# Patient Record
Sex: Female | Born: 1960 | Race: Black or African American | Hispanic: No | Marital: Single | Smoking: Former smoker
Health system: Southern US, Community
[De-identification: ages and names within clinical notes are randomized; demographics above are authoritative.]

## PROBLEM LIST (undated history)

## (undated) DIAGNOSIS — R0603 Acute respiratory distress: Secondary | ICD-10-CM

## (undated) DIAGNOSIS — F419 Anxiety disorder, unspecified: Secondary | ICD-10-CM

## (undated) DIAGNOSIS — J189 Pneumonia, unspecified organism: Secondary | ICD-10-CM

## (undated) DIAGNOSIS — J449 Chronic obstructive pulmonary disease, unspecified: Secondary | ICD-10-CM

## (undated) HISTORY — PX: KNEE ARTHROPLASTY: SHX992

---
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
## 2012-01-24 DIAGNOSIS — R0602 Shortness of breath: Secondary | ICD-10-CM

## 2012-01-25 DIAGNOSIS — R0602 Shortness of breath: Secondary | ICD-10-CM

## 2018-10-17 DIAGNOSIS — R0603 Acute respiratory distress: Secondary | ICD-10-CM

## 2018-10-17 HISTORY — DX: Acute respiratory distress: R06.03

## 2018-10-25 ENCOUNTER — Inpatient Hospital Stay (HOSPITAL_COMMUNITY): Payer: Medicaid Other

## 2018-10-25 ENCOUNTER — Inpatient Hospital Stay (HOSPITAL_COMMUNITY)
Admission: AD | Admit: 2018-10-25 | Discharge: 2018-11-04 | DRG: 870 | Disposition: A | Discharge status: Home Health | Payer: Medicaid Other | Source: Other Acute Inpatient Hospital | Attending: Internal Medicine | Admitting: Internal Medicine

## 2018-10-25 DIAGNOSIS — B952 Enterococcus as the cause of diseases classified elsewhere: Secondary | ICD-10-CM | POA: Diagnosis not present

## 2018-10-25 DIAGNOSIS — N390 Urinary tract infection, site not specified: Secondary | ICD-10-CM | POA: Diagnosis present

## 2018-10-25 DIAGNOSIS — Z6841 Body Mass Index (BMI) 40.0 and over, adult: Secondary | ICD-10-CM

## 2018-10-25 DIAGNOSIS — Z87891 Personal history of nicotine dependence: Secondary | ICD-10-CM | POA: Diagnosis not present

## 2018-10-25 DIAGNOSIS — Z7401 Bed confinement status: Secondary | ICD-10-CM

## 2018-10-25 DIAGNOSIS — J9602 Acute respiratory failure with hypercapnia: Secondary | ICD-10-CM

## 2018-10-25 DIAGNOSIS — E662 Morbid (severe) obesity with alveolar hypoventilation: Secondary | ICD-10-CM | POA: Diagnosis present

## 2018-10-25 DIAGNOSIS — J9600 Acute respiratory failure, unspecified whether with hypoxia or hypercapnia: Secondary | ICD-10-CM | POA: Diagnosis present

## 2018-10-25 DIAGNOSIS — A4181 Sepsis due to Enterococcus: Principal | ICD-10-CM | POA: Diagnosis present

## 2018-10-25 DIAGNOSIS — J449 Chronic obstructive pulmonary disease, unspecified: Secondary | ICD-10-CM | POA: Diagnosis not present

## 2018-10-25 DIAGNOSIS — Z9981 Dependence on supplemental oxygen: Secondary | ICD-10-CM

## 2018-10-25 DIAGNOSIS — B9689 Other specified bacterial agents as the cause of diseases classified elsewhere: Secondary | ICD-10-CM | POA: Diagnosis not present

## 2018-10-25 DIAGNOSIS — J9622 Acute and chronic respiratory failure with hypercapnia: Secondary | ICD-10-CM | POA: Diagnosis present

## 2018-10-25 DIAGNOSIS — Z9119 Patient's noncompliance with other medical treatment and regimen: Secondary | ICD-10-CM | POA: Diagnosis not present

## 2018-10-25 DIAGNOSIS — I110 Hypertensive heart disease with heart failure: Secondary | ICD-10-CM | POA: Diagnosis present

## 2018-10-25 DIAGNOSIS — G4733 Obstructive sleep apnea (adult) (pediatric): Secondary | ICD-10-CM | POA: Diagnosis not present

## 2018-10-25 DIAGNOSIS — R5381 Other malaise: Secondary | ICD-10-CM

## 2018-10-25 DIAGNOSIS — R451 Restlessness and agitation: Secondary | ICD-10-CM | POA: Diagnosis not present

## 2018-10-25 DIAGNOSIS — I272 Pulmonary hypertension, unspecified: Secondary | ICD-10-CM | POA: Diagnosis present

## 2018-10-25 DIAGNOSIS — E875 Hyperkalemia: Secondary | ICD-10-CM | POA: Diagnosis present

## 2018-10-25 DIAGNOSIS — G9341 Metabolic encephalopathy: Secondary | ICD-10-CM

## 2018-10-25 DIAGNOSIS — J9621 Acute and chronic respiratory failure with hypoxia: Secondary | ICD-10-CM | POA: Diagnosis present

## 2018-10-25 DIAGNOSIS — J9690 Respiratory failure, unspecified, unspecified whether with hypoxia or hypercapnia: Secondary | ICD-10-CM

## 2018-10-25 DIAGNOSIS — J441 Chronic obstructive pulmonary disease with (acute) exacerbation: Secondary | ICD-10-CM

## 2018-10-25 DIAGNOSIS — J9601 Acute respiratory failure with hypoxia: Secondary | ICD-10-CM

## 2018-10-25 DIAGNOSIS — L602 Onychogryphosis: Secondary | ICD-10-CM

## 2018-10-25 DIAGNOSIS — R7881 Bacteremia: Secondary | ICD-10-CM | POA: Diagnosis not present

## 2018-10-25 DIAGNOSIS — I5033 Acute on chronic diastolic (congestive) heart failure: Secondary | ICD-10-CM | POA: Diagnosis present

## 2018-10-25 DIAGNOSIS — Z20828 Contact with and (suspected) exposure to other viral communicable diseases: Secondary | ICD-10-CM | POA: Diagnosis present

## 2018-10-25 DIAGNOSIS — J9691 Respiratory failure, unspecified with hypoxia: Secondary | ICD-10-CM | POA: Diagnosis present

## 2018-10-25 DIAGNOSIS — I5030 Unspecified diastolic (congestive) heart failure: Secondary | ICD-10-CM | POA: Diagnosis not present

## 2018-10-25 DIAGNOSIS — G9340 Encephalopathy, unspecified: Secondary | ICD-10-CM | POA: Diagnosis not present

## 2018-10-25 DIAGNOSIS — Z9911 Dependence on respirator [ventilator] status: Secondary | ICD-10-CM | POA: Diagnosis not present

## 2018-10-25 DIAGNOSIS — F419 Anxiety disorder, unspecified: Secondary | ICD-10-CM | POA: Diagnosis present

## 2018-10-25 HISTORY — DX: Anxiety disorder, unspecified: F41.9

## 2018-10-25 HISTORY — DX: Pneumonia, unspecified organism: J18.9

## 2018-10-25 HISTORY — DX: Chronic obstructive pulmonary disease, unspecified: J44.9

## 2018-10-25 LAB — AMMONIA
Ammonia: 36 umol/L — ABNORMAL HIGH (ref 9–35)
Ammonia: 26 umol/L (ref 9–35)

## 2018-10-25 LAB — CBC WITH DIFFERENTIAL/PLATELET
Abs Immature Granulocytes: 0.13 10*3/uL — ABNORMAL HIGH (ref 0.00–0.07)
Basophils Absolute: 0 10*3/uL (ref 0.0–0.1)
Basophils Relative: 0 %
Eosinophils Absolute: 0 10*3/uL (ref 0.0–0.5)
Eosinophils Relative: 0 %
HCT: 33.7 % — ABNORMAL LOW (ref 36.0–46.0)
Hemoglobin: 9.4 g/dL — ABNORMAL LOW (ref 12.0–15.0)
Immature Granulocytes: 1 %
Lymphocytes Relative: 6 %
Lymphs Abs: 0.6 10*3/uL — ABNORMAL LOW (ref 0.7–4.0)
MCH: 27.2 pg (ref 26.0–34.0)
MCHC: 27.9 g/dL — ABNORMAL LOW (ref 30.0–36.0)
MCV: 97.4 fL (ref 80.0–100.0)
Monocytes Absolute: 0.3 10*3/uL (ref 0.1–1.0)
Monocytes Relative: 3 %
Neutro Abs: 9 10*3/uL — ABNORMAL HIGH (ref 1.7–7.7)
Neutrophils Relative %: 90 %
Platelets: 170 10*3/uL (ref 150–400)
RBC: 3.46 MIL/uL — ABNORMAL LOW (ref 3.87–5.11)
RDW: 14.6 % (ref 11.5–15.5)
WBC: 10 10*3/uL (ref 4.0–10.5)
nRBC: 0.5 % — ABNORMAL HIGH (ref 0.0–0.2)

## 2018-10-25 LAB — URINALYSIS, ROUTINE W REFLEX MICROSCOPIC
Bilirubin Urine: NEGATIVE
Glucose, UA: NEGATIVE mg/dL
Ketones, ur: NEGATIVE mg/dL
Nitrite: NEGATIVE
Protein, ur: 30 mg/dL — AB
Specific Gravity, Urine: 1.018 (ref 1.005–1.030)
WBC, UA: >50 WBC/hpf — ABNORMAL HIGH (ref 0–5)
pH: 5 (ref 5.0–8.0)

## 2018-10-25 LAB — POCT I-STAT 7, (LYTES, BLD GAS, ICA,H+H)
Acid-Base Excess: 9 mmol/L — ABNORMAL HIGH (ref 0.0–2.0)
Bicarbonate: 36.6 mmol/L — ABNORMAL HIGH (ref 20.0–28.0)
Calcium, Ion: 1.22 mmol/L (ref 1.15–1.40)
HCT: 32 % — ABNORMAL LOW (ref 36.0–46.0)
Hemoglobin: 10.9 g/dL — ABNORMAL LOW (ref 12.0–15.0)
O2 Saturation: 94 %
Patient temperature: 98.6
Potassium: 4.4 mmol/L (ref 3.5–5.1)
Sodium: 142 mmol/L (ref 135–145)
TCO2: 39 mmol/L — ABNORMAL HIGH (ref 22–32)
pCO2 arterial: 70.1 mmHg (ref 32.0–48.0)
pH, Arterial: 7.326 — ABNORMAL LOW (ref 7.350–7.450)
pO2, Arterial: 78 mmHg — ABNORMAL LOW (ref 83.0–108.0)

## 2018-10-25 LAB — COMPREHENSIVE METABOLIC PANEL
ALT: 11 U/L (ref 0–44)
AST: 18 U/L (ref 15–41)
Albumin: 2.4 g/dL — ABNORMAL LOW (ref 3.5–5.0)
Alkaline Phosphatase: 87 U/L (ref 38–126)
Anion gap: 11 (ref 5–15)
BUN: 35 mg/dL — ABNORMAL HIGH (ref 6–20)
CO2: 29 mmol/L (ref 22–32)
Calcium: 8.5 mg/dL — ABNORMAL LOW (ref 8.9–10.3)
Chloride: 99 mmol/L (ref 98–111)
Creatinine, Ser: 1.41 mg/dL — ABNORMAL HIGH (ref 0.44–1.00)
GFR calc Af Amer: 47 mL/min — ABNORMAL LOW (ref >60)
GFR calc non Af Amer: 41 mL/min — ABNORMAL LOW (ref >60)
Glucose, Bld: 128 mg/dL — ABNORMAL HIGH (ref 70–99)
Potassium: 4.7 mmol/L (ref 3.5–5.1)
Sodium: 139 mmol/L (ref 135–145)
Total Bilirubin: 0.5 mg/dL (ref 0.3–1.2)
Total Protein: 7.5 g/dL (ref 6.5–8.1)

## 2018-10-25 LAB — GLUCOSE, CAPILLARY
Glucose-Capillary: 105 mg/dL — ABNORMAL HIGH (ref 70–99)
Glucose-Capillary: 108 mg/dL — ABNORMAL HIGH (ref 70–99)
Glucose-Capillary: 113 mg/dL — ABNORMAL HIGH (ref 70–99)
Glucose-Capillary: 126 mg/dL — ABNORMAL HIGH (ref 70–99)
Glucose-Capillary: 138 mg/dL — ABNORMAL HIGH (ref 70–99)
Glucose-Capillary: 209 mg/dL — ABNORMAL HIGH (ref 70–99)

## 2018-10-25 LAB — PROCALCITONIN
Procalcitonin: <0.1 ng/mL
Procalcitonin: <0.1 ng/mL

## 2018-10-25 LAB — SARS CORONAVIRUS 2: SARS Coronavirus 2: NOT DETECTED

## 2018-10-25 LAB — MAGNESIUM: Magnesium: 1.9 mg/dL (ref 1.7–2.4)

## 2018-10-25 LAB — HIV ANTIBODY (ROUTINE TESTING W REFLEX): HIV Screen 4th Generation wRfx: NONREACTIVE

## 2018-10-25 LAB — PHOSPHORUS: Phosphorus: 2.1 mg/dL — ABNORMAL LOW (ref 2.5–4.6)

## 2018-10-25 LAB — MRSA PCR SCREENING: MRSA by PCR: NEGATIVE

## 2018-10-25 MED ORDER — SODIUM CHLORIDE 0.9 % IV SOLN
0.5000 mg/h | INTRAVENOUS | Status: DC
  Administered 2018-10-25: 1 mg/h via INTRAVENOUS
  Administered 2018-10-27 – 2018-10-29 (×5): 4 mg/h via INTRAVENOUS
  Administered 2018-10-30: 2 mg/h via INTRAVENOUS
  Filled 2018-10-25 (×8): qty 5

## 2018-10-25 MED ORDER — SODIUM CHLORIDE 0.9 % IV SOLN
1.0000 g | INTRAVENOUS | Status: DC
  Administered 2018-10-25: 1 g via INTRAVENOUS
  Filled 2018-10-25: qty 10

## 2018-10-25 MED ORDER — BISACODYL 10 MG RE SUPP: 10.0000 mg | Freq: Every day | RECTAL | Status: DC | PRN

## 2018-10-25 MED ORDER — FENTANYL BOLUS VIA INFUSION
50.0000 ug | INTRAVENOUS | Status: DC | PRN
  Administered 2018-10-25: 50 ug via INTRAVENOUS
  Filled 2018-10-25: qty 50

## 2018-10-25 MED ORDER — FUROSEMIDE 10 MG/ML IJ SOLN
40.0000 mg | Freq: Every day | INTRAMUSCULAR | Status: DC
  Administered 2018-10-25 – 2018-10-30 (×6): 40 mg via INTRAVENOUS
  Filled 2018-10-25 (×6): qty 4

## 2018-10-25 MED ORDER — CHLORHEXIDINE GLUCONATE 0.12% ORAL RINSE (MEDLINE KIT)
15.0000 mL | Freq: Two times a day (BID) | OROMUCOSAL | Status: DC
  Administered 2018-10-25 – 2018-10-30 (×11): 15 mL via OROMUCOSAL

## 2018-10-25 MED ORDER — SODIUM CHLORIDE 0.9 % IV SOLN
2.0000 g | Freq: Four times a day (QID) | INTRAVENOUS | Status: DC
  Administered 2018-10-25 – 2018-10-26 (×4): 2 g via INTRAVENOUS
  Filled 2018-10-25 (×6): qty 2000

## 2018-10-25 MED ORDER — FENTANYL CITRATE (PF) 100 MCG/2ML IJ SOLN
50.0000 ug | Freq: Once | INTRAMUSCULAR | Status: AC
  Administered 2018-10-25: 50 ug via INTRAVENOUS

## 2018-10-25 MED ORDER — METHYLPREDNISOLONE SODIUM SUCC 125 MG IJ SOLR
40.0000 mg | Freq: Four times a day (QID) | INTRAMUSCULAR | Status: DC
  Administered 2018-10-25 – 2018-10-27 (×10): 40 mg via INTRAVENOUS
  Filled 2018-10-25 (×10): qty 2

## 2018-10-25 MED ORDER — IPRATROPIUM-ALBUTEROL 0.5-2.5 (3) MG/3ML IN SOLN
3.0000 mL | Freq: Four times a day (QID) | RESPIRATORY_TRACT | Status: DC
  Administered 2018-10-25 – 2018-10-31 (×27): 3 mL via RESPIRATORY_TRACT
  Filled 2018-10-25 (×27): qty 3

## 2018-10-25 MED ORDER — MIDAZOLAM HCL 2 MG/2ML IJ SOLN
1.0000 mg | INTRAMUSCULAR | Status: DC | PRN
  Administered 2018-10-26 – 2018-10-27 (×9): 1 mg via INTRAVENOUS
  Filled 2018-10-25 (×11): qty 2

## 2018-10-25 MED ORDER — PANTOPRAZOLE SODIUM 40 MG IV SOLR
40.0000 mg | Freq: Every day | INTRAVENOUS | Status: DC
  Administered 2018-10-25 – 2018-10-27 (×4): 40 mg via INTRAVENOUS
  Filled 2018-10-25 (×4): qty 40

## 2018-10-25 MED ORDER — BUDESONIDE 0.5 MG/2ML IN SUSP
0.5000 mg | Freq: Two times a day (BID) | RESPIRATORY_TRACT | Status: DC
  Administered 2018-10-25 – 2018-11-04 (×19): 0.5 mg via RESPIRATORY_TRACT
  Filled 2018-10-25 (×21): qty 2

## 2018-10-25 MED ORDER — INSULIN ASPART 100 UNIT/ML ~~LOC~~ SOLN
0.0000 [IU] | SUBCUTANEOUS | Status: DC
  Administered 2018-10-25: 2 [IU] via SUBCUTANEOUS
  Administered 2018-10-25: 3 [IU] via SUBCUTANEOUS
  Administered 2018-10-26 – 2018-10-28 (×8): 1 [IU] via SUBCUTANEOUS
  Administered 2018-10-28: 2 [IU] via SUBCUTANEOUS
  Administered 2018-10-28 – 2018-10-29 (×8): 1 [IU] via SUBCUTANEOUS
  Administered 2018-10-29: 2 [IU] via SUBCUTANEOUS
  Administered 2018-10-30: 1 [IU] via SUBCUTANEOUS
  Administered 2018-10-30 (×2): 2 [IU] via SUBCUTANEOUS
  Administered 2018-10-30: 1 [IU] via SUBCUTANEOUS
  Administered 2018-10-31: 2 [IU] via SUBCUTANEOUS
  Administered 2018-10-31: 05:00:00 1 [IU] via SUBCUTANEOUS
  Administered 2018-11-01: 2 [IU] via SUBCUTANEOUS
  Administered 2018-11-01 (×2): 1 [IU] via SUBCUTANEOUS
  Administered 2018-11-01: 2 [IU] via SUBCUTANEOUS
  Administered 2018-11-02 – 2018-11-03 (×5): 1 [IU] via SUBCUTANEOUS

## 2018-10-25 MED ORDER — ALBUTEROL SULFATE (2.5 MG/3ML) 0.083% IN NEBU: 2.5000 mg | INHALATION_SOLUTION | RESPIRATORY_TRACT | Status: DC | PRN

## 2018-10-25 MED ORDER — ARFORMOTEROL TARTRATE 15 MCG/2ML IN NEBU
15.0000 ug | INHALATION_SOLUTION | Freq: Two times a day (BID) | RESPIRATORY_TRACT | Status: DC
  Administered 2018-10-25 – 2018-11-04 (×19): 15 ug via RESPIRATORY_TRACT
  Filled 2018-10-25 (×23): qty 2

## 2018-10-25 MED ORDER — SODIUM CHLORIDE 0.9 % IV SOLN
2.0000 g | Freq: Three times a day (TID) | INTRAVENOUS | Status: DC
  Administered 2018-10-25 – 2018-10-26 (×3): 2 g via INTRAVENOUS
  Filled 2018-10-25 (×5): qty 2

## 2018-10-25 MED ORDER — DEXMEDETOMIDINE HCL IN NACL 400 MCG/100ML IV SOLN
0.0000 ug/kg/h | INTRAVENOUS | Status: DC
  Administered 2018-10-25: 0.4 ug/kg/h via INTRAVENOUS
  Filled 2018-10-25: qty 100

## 2018-10-25 MED ORDER — ORAL CARE MOUTH RINSE
15.0000 mL | OROMUCOSAL | Status: DC
  Administered 2018-10-25 – 2018-10-30 (×52): 15 mL via OROMUCOSAL

## 2018-10-25 MED ORDER — DOCUSATE SODIUM 50 MG/5ML PO LIQD: 100.0000 mg | Freq: Two times a day (BID) | ORAL | Status: DC | PRN

## 2018-10-25 MED ORDER — HEPARIN SODIUM (PORCINE) 5000 UNIT/ML IJ SOLN
5000.0000 [IU] | Freq: Three times a day (TID) | INTRAMUSCULAR | Status: DC
  Administered 2018-10-25 – 2018-11-04 (×31): 5000 [IU] via SUBCUTANEOUS
  Filled 2018-10-25 (×31): qty 1

## 2018-10-25 MED ORDER — FENTANYL 2500MCG IN NS 250ML (10MCG/ML) PREMIX INFUSION
50.0000 ug/h | INTRAVENOUS | Status: DC
  Administered 2018-10-25: 50 ug/h via INTRAVENOUS
  Filled 2018-10-25: qty 250

## 2018-10-25 NOTE — Progress Notes (Signed)
175 ml Fentanyl wasted in sink with Virl Diamond RN.

## 2018-10-25 NOTE — H&P (Addendum)
NAME:  Laura PilotRosa Stewart, MRN:  [id number], DOB:  01/09/1961, LOS: 0 ADMISSION DATE:  10/25/2018, CONSULTATION DATE:  10/25/2018 REFERRING MD:  [facility], CHIEF COMPLAINT:  Acute Respiratory failure  Brief History   [age] yoF [facility] with morbid obesity and acute on chronic hypoxic respiratory failure and UTI.   History of present illness   HPI obtained from medical chart review as patient is intubated and sedated on mechanical ventilation.   58 year old female with history of morbid obesity with BMI 110, chronic respiratory failure on 3L [place], COPD, Pulmonary hypertension, OSA, non-compliant with CPAP, and Diastolic HF transferred [facility] for acute on chronic respiratory failure.   She was found by EMS unresponsive at home with hypoxia in the 50's.  On arrival [facility], she remained hypoxic on NRB with ABG showing pH 7.12, pCO2 125.7, PO2 44.3.  She was given lasix and required intubation.  She had tranisent hypotension on fentanyl and versed drips requiring levophed briefly.  CXR showed no infiltrate.  Workup noted for normal EKG with neg troponin, BNP 79, lower extremity ultrasound was negative for DVT, BMP showed mild hyperkalemia of 5.9 which was treated with kayexalate, otherwise normal.  UA showed moderate leukocytes with 6-10 WBC, WBC 9, normal procalcitonin, UDS negative,   Ammonia 41, normal LFTs, COVID negative.  Limited echo showed grade 2 diastolic dysfunction with EF 50-55%.  She was started on ceftriaxone for UTI and azithromycin for possible COPD exacerbation.   Blood cultures obtained showed GPC and enterococcus species and therefore started on vancomycin.   [facility] resources since patient's BMI exceeded their bariatric capabilities.  PCCM to accept.   Past Medical History  Chronic respiratory failure on 3L [place] O2 COPD Pulmonary hypertension OSA, non-compliant with CPAP Diastolic HF Former smoker   Significant Hospital Events   6[facility] to [facility]  Consults:   Procedures:  6/8  ETT >> 6/8  Foley >>  Significant Diagnostic Tests:  OHS- 6/8 BLE venous dopplers >> neg DVT            6/8 TTE- limited study, G2DD, EF 50-55%  Micro Data:  [place] 6/8 BC x 2 >> GPC and enterococcus species                     UC >>          COVID >> neg  6/9 BC x 2 >> 6/9 UC >>  6/9 trach asp >> 6/9 COVID >>  Antimicrobials:  6/8 azithro, ceftriaxone, vancomcyin 6/9 ceftriaxone >> 6/9 vancomcyin >>  Interim history/subjective:  Arrived on fentanyl drip at 510 mcg/hr Intermittently followed commands and reached for ETT for RN  Objective   Blood pressure (!) 112/95, pulse 86, temperature 98.6 F (37 C), temperature source Oral, resp. rate 18, SpO2 97 %.    Vent Mode: PRVC FiO2 (%):  [40 %-50 %] 40 % Set Rate:  [20 bmp] 20 bmp Vt Set:  [450 mL-500 mL] 500 mL PEEP:  [8 cmH20] 8 cmH20 Plateau Pressure:  [27 cmH20-29 cmH20] 27 cmH20   Intake/Output Summary (Last 24 hours) at 10/25/2018 0306 Last data filed at 10/25/2018 0300 Gross per 24 hour  Intake 0.3 ml  Output -  Net 0.3 ml   There were no vitals filed for this visit.  Examination: General:  Morbidly obese female sedated on MV in NAD HEENT: MM pink/moist, ETT, OGT,  pupils 4/reactive, anicteric  Neuro: will not f/c, withdrawals to noxious stimuli in all extremities,  tremor noted in right foot CV: SR, distant hs PULM: even/non-labored, diffuse exp wheeze throughout GI:  Obese, soft, bs active  Extremities: warm/dry, no pedal edema  Skin: no rashes  Resolved Hospital Problem list    Assessment & Plan:   Acute on chronic respiratory failure on baseline 3L home O2 Possible AECOPD OSA w/non compliance of CPAP, pulmonary hypertension and probable component of OHS P:  Full MV support, PRVC 8 cc/kg, rate 20 Wean FiO2 / PEEP for goal sat 88-96% duonebs q 6 and prn albuterol  CXR and ABG now Continue  solumedrol given ongoing exp wheeze  Daily SBT/ WUA    Sepsis - UTI and BC at OHS showing GPC/ enterococcus P:  Trend CBC/ fever curve Assess PCT Pan-culture here Continue ceftriaxone and vancomycin for now   Acute encephalopathy likely related to hypercarbia/ ammonia P:  Ongoing neuro exams- non focal thus far PAD protocol with fentanyl gtt wean as able and precedex for RASS goal 0/-1  Diastolic HF  P:  Tele monitoring Lasix 40 mg daily    At risk for AKI Hyperkalemia- 5.9- treated at OHS P:  Continue foley BMP/ Mag/ Phos now Strict I/O's   Elevated ammonia w/ normal LFTs - s/p lactulose at OHS P:  Repeat ammonia    Morbid obesity with BMI 110 P:  Will need weight loss counseling   Best practice:  Diet: NPO Pain/Anxiety/Delirium protocol (if indicated): Fentanyl /precedex VAP protocol (if indicated): yes DVT prophylaxis: heparin SQ GI prophylaxis: PPI Glucose control: CBG q 4, add SSI if > 180 Mobility: BR Code Status: Full  Family Communication:  Disposition: ICU  Labs   CBC: Recent Labs  Lab 10/25/18 0236  HGB 10.9*  HCT 32.0*    Basic Metabolic Panel: Recent Labs  Lab 10/25/18 0236  NA 142  K 4.4   GFR: CrCl cannot be calculated (No successful lab value found.). No results for input(s): PROCALCITON, WBC, LATICACIDVEN in the last 168 hours.  Liver Function Tests: No results for input(s): AST, ALT, ALKPHOS, BILITOT, PROT, ALBUMIN in the last 168 hours. No results for input(s): LIPASE, AMYLASE in the last 168 hours. No results for input(s): AMMONIA in the last 168 hours.  ABG    Component Value Date/Time   PHART 7.326 (L) 10/25/2018 0236   PCO2ART 70.1 (HH) 10/25/2018 0236   PO2ART 78.0 (L) 10/25/2018 0236   HCO3 36.6 (H) 10/25/2018 0236   TCO2 39 (H) 10/25/2018 0236   O2SAT 94.0 10/25/2018 0236     Coagulation Profile: No results for input(s): INR, PROTIME in the last 168 hours.  Cardiac Enzymes: No results for input(s):  CKTOTAL, CKMB, CKMBINDEX, TROPONINI in the last 168 hours.  HbA1C: No results found for: HGBA1C  CBG: Recent Labs  Lab 10/25/18 0237  GLUCAP 108*    Review of Systems:   Unable.  Past Medical History  Acute on chronic respiratory failure on 3L  COPD Pulmonary hypertension OSA, non-compliant with CPAP Diastolic HF  Surgical History   unable  Social History   unknown  Family History   Her family history is not on file.   Allergies Allergies not on file   Home Medications  Prior to Admission medications   Not on File     Critical care time: 45 mins   Patient seen examined chart reviewed and case discussed with Mrs Moshe Cipro.  Agree with  Above assessment  and plan. Posey BoyerBrooke Simpson, MSN, AGACNP-BC [facility] Pgr: [phone] or if no answer [phone] 10/25/2018, 3:17 AM

## 2018-10-25 NOTE — Progress Notes (Signed)
eLink Physician-Brief Progress Note Patient Name: Laura Stewart DOB: May 19, 1960 MRN: [id number]   Date of Service  10/25/2018  HPI/Events of Note  58 yo female with PMH of COPD. Presents in transfer from [place], [place]. with hypoxic respiratory failure. Intubated and ventilated. No information available to [facility]. VSS.  eICU Interventions  No new orders.     Intervention Category Evaluation Type: New Patient Evaluation  Jiles Goya Eugene 10/25/2018, 2:16 AM

## 2018-10-26 ENCOUNTER — Inpatient Hospital Stay (HOSPITAL_COMMUNITY): Payer: Medicaid Other

## 2018-10-26 DIAGNOSIS — J9600 Acute respiratory failure, unspecified whether with hypoxia or hypercapnia: Secondary | ICD-10-CM

## 2018-10-26 DIAGNOSIS — I5030 Unspecified diastolic (congestive) heart failure: Secondary | ICD-10-CM

## 2018-10-26 DIAGNOSIS — Z6841 Body Mass Index (BMI) 40.0 and over, adult: Secondary | ICD-10-CM

## 2018-10-26 DIAGNOSIS — R7881 Bacteremia: Secondary | ICD-10-CM

## 2018-10-26 DIAGNOSIS — I272 Pulmonary hypertension, unspecified: Secondary | ICD-10-CM

## 2018-10-26 DIAGNOSIS — J449 Chronic obstructive pulmonary disease, unspecified: Secondary | ICD-10-CM

## 2018-10-26 DIAGNOSIS — B952 Enterococcus as the cause of diseases classified elsewhere: Secondary | ICD-10-CM

## 2018-10-26 DIAGNOSIS — E662 Morbid (severe) obesity with alveolar hypoventilation: Secondary | ICD-10-CM

## 2018-10-26 DIAGNOSIS — J9602 Acute respiratory failure with hypercapnia: Secondary | ICD-10-CM

## 2018-10-26 DIAGNOSIS — Z9911 Dependence on respirator [ventilator] status: Secondary | ICD-10-CM

## 2018-10-26 LAB — COMPREHENSIVE METABOLIC PANEL
ALT: 12 U/L (ref 0–44)
AST: 17 U/L (ref 15–41)
Albumin: 2.6 g/dL — ABNORMAL LOW (ref 3.5–5.0)
Alkaline Phosphatase: 79 U/L (ref 38–126)
Anion gap: 11 (ref 5–15)
BUN: 46 mg/dL — ABNORMAL HIGH (ref 6–20)
CO2: 32 mmol/L (ref 22–32)
Calcium: 8.6 mg/dL — ABNORMAL LOW (ref 8.9–10.3)
Chloride: 98 mmol/L (ref 98–111)
Creatinine, Ser: 1.57 mg/dL — ABNORMAL HIGH (ref 0.44–1.00)
GFR calc Af Amer: 42 mL/min — ABNORMAL LOW (ref >60)
GFR calc non Af Amer: 36 mL/min — ABNORMAL LOW (ref >60)
Glucose, Bld: 132 mg/dL — ABNORMAL HIGH (ref 70–99)
Potassium: 4.6 mmol/L (ref 3.5–5.1)
Sodium: 141 mmol/L (ref 135–145)
Total Bilirubin: 0.6 mg/dL (ref 0.3–1.2)
Total Protein: 7.7 g/dL (ref 6.5–8.1)

## 2018-10-26 LAB — URINE CULTURE: Culture: NO GROWTH

## 2018-10-26 LAB — MAGNESIUM: Magnesium: 2 mg/dL (ref 1.7–2.4)

## 2018-10-26 LAB — CBC
HCT: 35.6 % — ABNORMAL LOW (ref 36.0–46.0)
Hemoglobin: 10 g/dL — ABNORMAL LOW (ref 12.0–15.0)
MCH: 26.8 pg (ref 26.0–34.0)
MCHC: 28.1 g/dL — ABNORMAL LOW (ref 30.0–36.0)
MCV: 95.4 fL (ref 80.0–100.0)
Platelets: 177 10*3/uL (ref 150–400)
RBC: 3.73 MIL/uL — ABNORMAL LOW (ref 3.87–5.11)
RDW: 15.3 % (ref 11.5–15.5)
WBC: 10 10*3/uL (ref 4.0–10.5)
nRBC: 0.3 % — ABNORMAL HIGH (ref 0.0–0.2)

## 2018-10-26 LAB — PROCALCITONIN: Procalcitonin: <0.1 ng/mL

## 2018-10-26 LAB — GLUCOSE, CAPILLARY
Glucose-Capillary: 116 mg/dL — ABNORMAL HIGH (ref 70–99)
Glucose-Capillary: 118 mg/dL — ABNORMAL HIGH (ref 70–99)
Glucose-Capillary: 120 mg/dL — ABNORMAL HIGH (ref 70–99)
Glucose-Capillary: 129 mg/dL — ABNORMAL HIGH (ref 70–99)
Glucose-Capillary: 135 mg/dL — ABNORMAL HIGH (ref 70–99)
Glucose-Capillary: 139 mg/dL — ABNORMAL HIGH (ref 70–99)

## 2018-10-26 LAB — PHOSPHORUS: Phosphorus: 4 mg/dL (ref 2.5–4.6)

## 2018-10-26 MED ORDER — CHLORHEXIDINE GLUCONATE CLOTH 2 % EX PADS: 6.0000 | MEDICATED_PAD | Freq: Every day | CUTANEOUS | Status: DC

## 2018-10-26 MED ORDER — VANCOMYCIN HCL 10 G IV SOLR
1500.0000 mg | INTRAVENOUS | Status: AC
  Administered 2018-10-27 – 2018-10-30 (×4): 1500 mg via INTRAVENOUS
  Filled 2018-10-26 (×4): qty 1500

## 2018-10-26 MED ORDER — VANCOMYCIN HCL 10 G IV SOLR
2500.0000 mg | Freq: Once | INTRAVENOUS | Status: AC
  Administered 2018-10-26: 2500 mg via INTRAVENOUS
  Filled 2018-10-26: qty 2000

## 2018-10-26 NOTE — Progress Notes (Signed)
Initial Nutrition Assessment  DOCUMENTATION CODES:   Morbid obesity  INTERVENTION:   Recommend initiate TF via OGT:   Vital High Protein at 40 ml/h (960 ml per day)   Pro-stat 30 ml BID   Provides 1160 kcal, 114 gm protein, 803 ml free water daily  NUTRITION DIAGNOSIS:   Inadequate oral intake related to inability to eat as evidenced by NPO status.  GOAL:   Provide needs based on ASPEN/SCCM guidelines  MONITOR:   Vent status, Labs, Skin, I & O's  REASON FOR ASSESSMENT:   Ventilator    ASSESSMENT:   58 yo female admitted with acute respiratory failure requiring intubation. PMH includes super morbid obesity, COPD, pulmonary HTN, OSA, HF.  Patient is currently intubated on ventilator support; CXR showed improved pulmonary edema. MV: 9.6 L/min Temp (24hrs), Avg:98.6 F (37 C), Min:97.9 F (36.6 C), Max:99.1 F (37.3 C)   Labs reviewed. BUN 46 (H), creatinine 1.57 (H) CBG's: [phone]  Medications reviewed and include Lasix, Novolog, Solu-medrol.   NUTRITION - FOCUSED PHYSICAL EXAM:  deferred  Diet Order:   Diet Order            Diet NPO time specified  Diet effective now              EDUCATION NEEDS:   Not appropriate for education at this time  Skin:  Skin Assessment: Reviewed RN Assessment  Last BM:  no BM documented  Height:   Ht Readings from Last 1 Encounters:  10/25/18 4\' 10"  (1.473 m)    Weight:   Wt Readings from Last 1 Encounters:  10/26/18 (!) 238.6 kg    Ideal Body Weight:  43.9 kg  BMI:  Body mass index is 109.93 kg/m.  Estimated Nutritional Needs:   Kcal:  [phone]  Protein:  110 gm  Fluid:  >/= 1.5 L    Molli Barrows, RD, LDN, [place] Pager [phone] After Hours Pager [phone]

## 2018-10-26 NOTE — Consult Note (Signed)
[facility] for Infectious Disease       Reason for Consult: Enterococcal bacteremia  Referring Physician: CHAMP autoconsult  Active Problems:   Acute respiratory failure ([place])   . arformoterol  15 mcg Nebulization BID  . budesonide (PULMICORT) nebulizer solution  0.5 mg Nebulization BID  . chlorhexidine gluconate (MEDLINE KIT)  15 mL Mouth Rinse BID  . furosemide  40 mg Intravenous Daily  . heparin  5,000 Units Subcutaneous Q8H  . insulin aspart  0-9 Units Subcutaneous Q4H  . ipratropium-albuterol  3 mL Nebulization Q6H  . mouth rinse  15 mL Mouth Rinse 10 times per day  . methylPREDNISolone (SOLU-MEDROL) injection  40 mg Intravenous Q6H  . pantoprazole (PROTONIX) IV  40 mg Intravenous QHS    Recommendations: Vancomycin Repeat cultures sent   Assessment: She has Enterococcus in 1 bottle and 2 sets with CoNS, sensitvities pending; respiratory failure requiring intubation though procalcitonin negative for infection and CXR with a small opacity likely atelectasis.      Antibiotics: Ampicillin and cefepime  HPI: Laura Stewart is a 58 y.o. female with COPD, morbid obesity with BMI of 110 and presented to OSH in [place] with respiratory failure and intubated.  History unobtainable from the patient.  Blood cultures growing as above.  Transferred here due to her obesity for further management.   CXR independently reviewed and patchy areas, mild focal opacity.    Review of Systems: Unobtainable due to patient factors   No past medical history on file. From chart review, not obtainable from the patient Pulmonary hypertension, CHF with preserved EF,COPD on 3L O2 continuously, OSA  Social History   Tobacco Use  . Smoking status: Not on file  Substance Use Topics  . Alcohol use: Not on file  . Drug use: Not on file   From chart review, not obtainable from the patient  No family history on file. From chart review, not obtainable from the patient  Not on File  From chart review, not obtainable from the patient  Physical Exam: Constitutional: NAD   Vitals:   10/26/18 0715 10/26/18 0800  BP:  (!) 105/52  Pulse:  83  Resp:  (!) 21  Temp: 98.7 F (37.1 C)   SpO2:  96%   EYES: anicteric ENMT: +ET Cardiovascular: Cor RRR Respiratory: breathing assisted on vent; coarse breath sounds anteriorly GI: Bowel sounds are normal, morbidly obese Musculoskeletal: no pedal edema noted Skin: negatives: no rash Neuro: responsive, moving all extremities Lines: peripheral  Lab Results  Component Value Date   WBC 10.0 10/26/2018   HGB 10.0 (L) 10/26/2018   HCT 35.6 (L) 10/26/2018   MCV 95.4 10/26/2018   PLT 177 10/26/2018    Lab Results  Component Value Date   CREATININE 1.57 (H) 10/26/2018   BUN 46 (H) 10/26/2018   NA 141 10/26/2018   K 4.6 10/26/2018   CL 98 10/26/2018   CO2 32 10/26/2018    Lab Results  Component Value Date   ALT 12 10/26/2018   AST 17 10/26/2018   ALKPHOS 79 10/26/2018     Microbiology: Recent Results (from the past 240 hour(s))  MRSA PCR Screening     Status: None   Collection Time: 10/25/18  2:28 AM  Result Value Ref Range Status   MRSA by PCR NEGATIVE NEGATIVE Final    Comment:        The GeneXpert MRSA Assay (FDA approved for NASAL specimens only), is one component of a comprehensive MRSA colonization  surveillance program. It is not intended to diagnose MRSA infection nor to guide or monitor treatment for MRSA infections. Performed at [facility], [place] [address], [place], [place] [address]   Culture, respiratory (non-expectorated)     Status: None (Preliminary result)   Collection Time: 10/25/18  2:28 AM  Result Value Ref Range Status   Specimen Description TRACHEAL ASPIRATE  Final   Special Requests NONE  Final   Gram Stain   Final    FEW WBC PRESENT,BOTH PMN AND MONONUCLEAR RARE GRAM POSITIVE COCCI IN PAIRS    Culture   Final    CULTURE REINCUBATED FOR BETTER GROWTH Performed at [facility], [place] [address] [place], [place] [address]    Report Status PENDING  Incomplete  SARS Coronavirus 2     Status: None   Collection Time: 10/25/18  2:48 AM  Result Value Ref Range Status   SARS Coronavirus 2 NOT DETECTED NOT DETECTED Final    Comment: (NOTE) SARS-CoV-2 target nucleic acids are NOT DETECTED. The SARS-CoV-2 RNA is generally detectable in upper and lower respiratory specimens during the acute phase of infection.  Negative  results do not preclude SARS-CoV-2 infection, do not rule out co-infections with other pathogens, and should not be used as the sole basis for treatment or other patient management decisions.  Negative results must be combined with clinical observations, patient history, and epidemiological information. The expected result is Not Detected. Fact Sheet for Patients: http://www.biofiredefense.com/wp-content/uploads/2020/03/BIOFIRE-COVID -19-patients.pdf Fact Sheet for Healthcare Providers: http://www.biofiredefense.com/wp-content/uploads/2020/03/BIOFIRE-COVID -19-hcp.pdf This test is not yet approved or cleared by the [place] and  has been authorized for detection and/or diagnosis of SARS-CoV-2 by FDA under an Emergency Use Authorization (EUA).  This EUA will remain in effec t (meaning this test can be used) for the duration of  the COVID-19 declaration under Section 564(b)(1) of the Act, 21 U.S.C. section 360bbb-3(b)(1), unless the authorization is terminated or revoked sooner. Performed at [facility], [place] [address] [place], [place] [address]   Urine culture     Status: None   Collection Time: 10/25/18  5:03 AM  Result Value Ref Range Status   Specimen Description URINE, RANDOM  Final   Special Requests NONE  Final   Culture   Final    NO GROWTH Performed at [facility], [place] [address], [place], [place] [address]    Report Status 10/26/2018 FINAL  Final    Thayer Headings, [place] for  Infectious Disease [facility] [email or web address] 10/26/2018, 9:46 AM

## 2018-10-26 NOTE — Progress Notes (Signed)
Pharmacy Antibiotic Note  Laura Stewart is a 58 y.o. female admitted on 10/25/2018 with bacteremia from cultures taken at OSH prior to transfer.  Pharmacy has been consulted for vancomycin dosing.  Plan: Vancomycin 2,500 mg IV x 1 followed by: Vancomycin 1500 mg IV Q 24 hrs. Goal AUC 400-550. Expected AUC: 496.4 SCr used: 1.57 Monitor renal function, cultures, and vancomycin levels as needed  Height: 4\' 10"  (147.3 cm) Weight: (!) 526 lb (238.6 kg) IBW/kg (Calculated) : 40.9  Temp (24hrs), Avg:98.2 F (36.8 C), Min:97.5 F (36.4 C), Max:98.7 F (37.1 C)  Recent Labs  Lab 10/25/18 0325 10/26/18 0414  WBC 10.0 10.0  CREATININE 1.41* 1.57*    Estimated Creatinine Clearance: 74 mL/min (A) (by C-G formula based on SCr of 1.57 mg/dL (H)).    Not on File  Antimicrobials this admission: OSH: azithromycin, ceftriaxone, vancomycin  Ceftriaxone 6/9 >> 6/9 Cefepime 6/9 >> 6/10 Ampicillin 6/9 >> 6/10 Vanc 6/10 >>  Dose adjustments this admission: None  Microbiology results: OSH records: BCx - GPC and enterococcus species ---6/9: no vanc resistance; coag neg staph, no MRSA detected - 6/8 blood cultures: 1/2 bottles enterococcus faecalis (amp-S, Vanc-S) and coag negative staph in 1/2 of 2 sets - will clarify further w/ microlab - 6/9 Urine CX: NGTD   6/9 MRSA: neg 6/9 COVID: neg 6/9 BCx:  6/9 UCx: ngF 6/9 TA: reincubated  Thank you for allowing pharmacy to be a part of this patient's care.  Vertis Kelch, PharmD PGY1 Pharmacy Resident Phone [phone] 10/26/2018       10:33 AM

## 2018-10-26 NOTE — H&P (Signed)
NAME:  Laura Stewart, MRN:  [id number], DOB:  01/22/1961, LOS: 1 ADMISSION DATE:  10/25/2018, CONSULTATION DATE:  10/25/2018 REFERRING MD:  [facility], CHIEF COMPLAINT:  Acute Respiratory failure  Brief History   [age] yoF [facility] with morbid obesity and acute on chronic hypoxic respiratory failure and UTI.   History of present illness   HPI obtained from medical chart review as patient is intubated and sedated on mechanical ventilation.   58 year old female with history of morbid obesity with BMI 110, chronic respiratory failure on 3L [place], COPD, Pulmonary hypertension, OSA, non-compliant with CPAP, and Diastolic HF transferred [facility] for acute on chronic respiratory failure.   She was found by EMS unresponsive at home with hypoxia in the 50's.  On [facility], she remained hypoxic on NRB with ABG showing pH 7.12, pCO2 125.7, PO2 44.3.  She was given lasix and required intubation.  She had tranisent hypotension on fentanyl and versed drips requiring levophed briefly.  CXR showed no infiltrate.  Workup noted for normal EKG with neg troponin, BNP 79, lower extremity ultrasound was negative for DVT, BMP showed mild hyperkalemia of 5.9 which was treated with kayexalate, otherwise normal.  UA showed moderate leukocytes with 6-10 WBC, WBC 9, normal procalcitonin, UDS negative,   Ammonia 41, normal LFTs, COVID negative.  Limited echo showed grade 2 diastolic dysfunction with EF 50-55%.  She was started on ceftriaxone for UTI and azithromycin for possible COPD exacerbation.   Blood cultures obtained showed GPC and enterococcus species and therefore started on vancomycin.   [facility] since patient's BMI exceeded their bariatric capabilities.  PCCM to accept.   Past Medical History  Chronic respiratory failure on 3L [place] O2 COPD Pulmonary hypertension OSA, non-compliant with CPAP Diastolic HF Former smoker   Significant Hospital Events   6/8 [facility] to [facility]  Consults:  6/9 id Procedures:  6/8  ETT >> 6/8  Foley >>  Significant Diagnostic Tests:  OHS- 6/8 BLE venous dopplers >> neg DVT            6/8 TTE- limited study, G2DD, EF 50-55%  Micro Data:  [place] 6/8 BC x 2 >> GPC and enterococcus species                     UC >>          COVID >> neg  6/9 BC x 2 >> 6/9 UC >> negative 6/9 trach asp >> 6/9 COVID >>neg  Antimicrobials:  6/8 azithro, ceftriaxone, vancomcyin 6/9 ceftriaxone >>6/10 6/9 vancomcyin >>6/10  Interim history/subjective:  Awake follows commands  Objective   Blood pressure 123/63, pulse 99, temperature 98.7 F (37.1 C), temperature source Oral, resp. rate (!) 23, height 4\' 10"  (1.473 m), weight (!) 238.6 kg, SpO2 97 %.    Vent Mode: PRVC FiO2 (%):  [40 %] 40 % Set Rate:  [20 bmp] 20 bmp Vt Set:  [500 mL] 500 mL PEEP:  [8 cmH20] 8 cmH20 Plateau Pressure:  [13 cmH20-30 cmH20] 13 cmH20   Intake/Output Summary (Last 24 hours) at 10/26/2018 1011 Last data filed at 10/26/2018 1000 Gross per 24 hour  Intake 865.57 ml  Output 1775 ml  Net -909.43 ml   Filed Weights   10/25/18 0400 10/26/18 0500  Weight: (!) 239.5 kg (!) 238.6 kg    Examination: General: Super morbidly obese female HEENT: Endotracheal gastric tube  in place Neuro: Follows commands moves all extremities CV: Sounds are distant PULM: Mild rhonchi and wheezing GI: Obese soft nontender positive bowel sounds Extremities: warm/dry, cool to assess edema  Skin: no rashes or lesions   Resolved Hospital Problem list    Assessment & Plan:   Acute on chronic respiratory failure on baseline 3L home O2 Possible AECOPD OSA w/non compliance of CPAP, pulmonary hypertension and probable component of OHS P:  Continue full mechanical ventilatory support Wean as tolerated Bronchodilators and Solu-Medrol Spontaneous breathing trial wake up assessment She most likely needs a  tracheostomy due to her body habitus  A  Sepsis - UTI and BC at OHS showing GPC/ enterococcus P:  Continue monitor All culture data Continue vancomycin per ID    Acute encephalopathy likely related to hypercarbia/ ammonia P:  Minimal sedation Currently awake and alert  Diastolic HF  P:  Cardiac monitoring Continue diuresis   At risk for AKI Hyperkalemia- 5.9- treated at OHS Lab Results  Component Value Date   CREATININE 1.57 (H) 10/26/2018   CREATININE 1.41 (H) 10/25/2018   Recent Labs  Lab 10/25/18 0236 10/25/18 0325 10/26/18 0414  K 4.4 4.7 4.6    P:  Monitor and replete electrolytes as needed   Elevated ammonia w/ normal LFTs - s/p lactulose at OHS P:  Ammonia level decreased to 26   Morbid obesity with BMI 110 P:  Weight loss counseling in the future   Best practice:  Diet: NPO Pain/Anxiety/Delirium protocol (if indicated): Fentanyl /precedex VAP protocol (if indicated): yes DVT prophylaxis: heparin SQ GI prophylaxis: PPI Glucose control: CBG q 4, add SSI if > 180 Mobility: BR Code Status: Full  Family Communication:  Disposition: ICU  Labs   CBC: Recent Labs  Lab 10/25/18 0236 10/25/18 0325 10/26/18 0414  WBC  --  10.0 10.0  NEUTROABS  --  9.0*  --   HGB 10.9* 9.4* 10.0*  HCT 32.0* 33.7* 35.6*  MCV  --  97.4 95.4  PLT  --  170 790    Basic Metabolic Panel: Recent Labs  Lab 10/25/18 0236 10/25/18 0325 10/26/18 0414  NA 142 139 141  K 4.4 4.7 4.6  CL  --  99 98  CO2  --  29 32  GLUCOSE  --  128* 132*  BUN  --  35* 46*  CREATININE  --  1.41* 1.57*  CALCIUM  --  8.5* 8.6*  MG  --  1.9 2.0  PHOS  --  2.1* 4.0   GFR: Estimated Creatinine Clearance: 74 mL/min (A) (by C-G formula based on SCr of 1.57 mg/dL (H)). Recent Labs  Lab 10/25/18 0325 10/26/18 0414  PROCALCITON <0.10  <0.10 <0.10  WBC 10.0 10.0    Liver Function Tests: Recent Labs  Lab 10/25/18 0325 10/26/18 0414  AST 18 17  ALT 11 12  ALKPHOS 87  79  BILITOT 0.5 0.6  PROT 7.5 7.7  ALBUMIN 2.4* 2.6*   No results for input(s): LIPASE, AMYLASE in the last 168 hours. Recent Labs  Lab 10/25/18 0325 10/25/18 1252  AMMONIA 36* 26    ABG    Component Value Date/Time   PHART 7.326 (L) 10/25/2018 0236   PCO2ART 70.1 (HH) 10/25/2018 0236   PO2ART 78.0 (L) 10/25/2018 0236   HCO3 36.6 (H) 10/25/2018 0236   TCO2 39 (H) 10/25/2018 0236   O2SAT 94.0 10/25/2018 0236     Coagulation Profile: No results for input(s): INR, PROTIME in the last 168 hours.  Cardiac Enzymes: No results for input(s): CKTOTAL, CKMB, CKMBINDEX, TROPONINI in the last 168 hours.  HbA1C: No results found for: HGBA1C  CBG: Recent Labs  Lab 10/25/18 1534 10/25/18 1935 10/25/18 2331 10/26/18 0355 10/26/18 0732  GLUCAP 105* 113* 209* 120* 139*     Critical care time: 30 mins   Brett CanalesSteve Salaya Holtrop ACNP [facility] Pager [phone] till 1 pm If no answer page [phone] 10/26/2018, 10:12 AM

## 2018-10-27 ENCOUNTER — Inpatient Hospital Stay (HOSPITAL_COMMUNITY): Payer: Medicaid Other

## 2018-10-27 ENCOUNTER — Inpatient Hospital Stay: Payer: Self-pay

## 2018-10-27 DIAGNOSIS — J9621 Acute and chronic respiratory failure with hypoxia: Secondary | ICD-10-CM

## 2018-10-27 DIAGNOSIS — J9622 Acute and chronic respiratory failure with hypercapnia: Secondary | ICD-10-CM

## 2018-10-27 DIAGNOSIS — R451 Restlessness and agitation: Secondary | ICD-10-CM

## 2018-10-27 DIAGNOSIS — J441 Chronic obstructive pulmonary disease with (acute) exacerbation: Secondary | ICD-10-CM

## 2018-10-27 DIAGNOSIS — B9689 Other specified bacterial agents as the cause of diseases classified elsewhere: Secondary | ICD-10-CM

## 2018-10-27 LAB — CBC
HCT: 34.7 % — ABNORMAL LOW (ref 36.0–46.0)
Hemoglobin: 10 g/dL — ABNORMAL LOW (ref 12.0–15.0)
MCH: 27 pg (ref 26.0–34.0)
MCHC: 28.8 g/dL — ABNORMAL LOW (ref 30.0–36.0)
MCV: 93.5 fL (ref 80.0–100.0)
Platelets: 170 10*3/uL (ref 150–400)
RBC: 3.71 MIL/uL — ABNORMAL LOW (ref 3.87–5.11)
RDW: 15.1 % (ref 11.5–15.5)
WBC: 7.7 10*3/uL (ref 4.0–10.5)
nRBC: 0.4 % — ABNORMAL HIGH (ref 0.0–0.2)

## 2018-10-27 LAB — MAGNESIUM: Magnesium: 2 mg/dL (ref 1.7–2.4)

## 2018-10-27 LAB — GLUCOSE, CAPILLARY
Glucose-Capillary: 117 mg/dL — ABNORMAL HIGH (ref 70–99)
Glucose-Capillary: 123 mg/dL — ABNORMAL HIGH (ref 70–99)
Glucose-Capillary: 123 mg/dL — ABNORMAL HIGH (ref 70–99)
Glucose-Capillary: 135 mg/dL — ABNORMAL HIGH (ref 70–99)
Glucose-Capillary: 137 mg/dL — ABNORMAL HIGH (ref 70–99)
Glucose-Capillary: 145 mg/dL — ABNORMAL HIGH (ref 70–99)

## 2018-10-27 LAB — COMPREHENSIVE METABOLIC PANEL
ALT: 16 U/L (ref 0–44)
AST: 20 U/L (ref 15–41)
Albumin: 2.6 g/dL — ABNORMAL LOW (ref 3.5–5.0)
Alkaline Phosphatase: 74 U/L (ref 38–126)
Anion gap: 10 (ref 5–15)
BUN: 51 mg/dL — ABNORMAL HIGH (ref 6–20)
CO2: 32 mmol/L (ref 22–32)
Calcium: 8.7 mg/dL — ABNORMAL LOW (ref 8.9–10.3)
Chloride: 102 mmol/L (ref 98–111)
Creatinine, Ser: 1.53 mg/dL — ABNORMAL HIGH (ref 0.44–1.00)
GFR calc Af Amer: 43 mL/min — ABNORMAL LOW (ref >60)
GFR calc non Af Amer: 37 mL/min — ABNORMAL LOW (ref >60)
Glucose, Bld: 137 mg/dL — ABNORMAL HIGH (ref 70–99)
Potassium: 4.4 mmol/L (ref 3.5–5.1)
Sodium: 144 mmol/L (ref 135–145)
Total Bilirubin: 0.7 mg/dL (ref 0.3–1.2)
Total Protein: 7.5 g/dL (ref 6.5–8.1)

## 2018-10-27 LAB — PHOSPHORUS: Phosphorus: 3.3 mg/dL (ref 2.5–4.6)

## 2018-10-27 MED ORDER — VITAL HIGH PROTEIN PO LIQD
1000.0000 mL | ORAL | Status: DC
  Administered 2018-10-27 – 2018-10-29 (×3): 1000 mL

## 2018-10-27 MED ORDER — PROPOFOL 1000 MG/100ML IV EMUL
INTRAVENOUS | Status: AC
  Administered 2018-10-27: 20 ug/kg/min via INTRAVENOUS
  Filled 2018-10-27: qty 100

## 2018-10-27 MED ORDER — SODIUM CHLORIDE 0.9% FLUSH
10.0000 mL | Freq: Two times a day (BID) | INTRAVENOUS | Status: DC
  Administered 2018-10-27 – 2018-11-03 (×9): 10 mL

## 2018-10-27 MED ORDER — FUROSEMIDE 10 MG/ML IJ SOLN
40.0000 mg | Freq: Once | INTRAMUSCULAR | Status: AC
  Administered 2018-10-27: 40 mg via INTRAVENOUS
  Filled 2018-10-27: qty 4

## 2018-10-27 MED ORDER — METHYLPREDNISOLONE SODIUM SUCC 125 MG IJ SOLR
40.0000 mg | Freq: Three times a day (TID) | INTRAMUSCULAR | Status: DC
  Administered 2018-10-27 – 2018-11-02 (×18): 40 mg via INTRAVENOUS
  Filled 2018-10-27 (×18): qty 2

## 2018-10-27 MED ORDER — CHLORHEXIDINE GLUCONATE CLOTH 2 % EX PADS: 6.0000 | MEDICATED_PAD | Freq: Every day | CUTANEOUS | Status: DC

## 2018-10-27 MED ORDER — POLYETHYLENE GLYCOL 3350 17 G PO PACK
17.0000 g | PACK | Freq: Every day | ORAL | Status: DC | PRN
  Administered 2018-10-27 – 2018-10-30 (×2): 17 g
  Filled 2018-10-27 (×2): qty 1

## 2018-10-27 MED ORDER — DOCUSATE SODIUM 50 MG/5ML PO LIQD
100.0000 mg | Freq: Two times a day (BID) | ORAL | Status: DC
  Administered 2018-10-27 – 2018-10-30 (×6): 100 mg
  Filled 2018-10-27 (×6): qty 10

## 2018-10-27 MED ORDER — POLYETHYLENE GLYCOL 3350 17 G PO PACK
17.0000 g | PACK | Freq: Every day | ORAL | Status: DC | PRN
  Filled 2018-10-27: qty 1

## 2018-10-27 MED ORDER — PRO-STAT SUGAR FREE PO LIQD
30.0000 mL | Freq: Two times a day (BID) | ORAL | Status: DC
  Administered 2018-10-27 – 2018-10-30 (×7): 30 mL
  Filled 2018-10-27 (×7): qty 30

## 2018-10-27 MED ORDER — PROPOFOL 1000 MG/100ML IV EMUL
0.0000 ug/kg/min | INTRAVENOUS | Status: DC
  Administered 2018-10-27: 20 ug/kg/min via INTRAVENOUS
  Administered 2018-10-28: 8 ug/kg/min via INTRAVENOUS
  Administered 2018-10-28 (×2): 20 ug/kg/min via INTRAVENOUS
  Filled 2018-10-27 (×3): qty 100

## 2018-10-27 MED ORDER — MIDAZOLAM HCL 2 MG/2ML IJ SOLN
1.0000 mg | Freq: Once | INTRAMUSCULAR | Status: AC
  Administered 2018-10-27: 1 mg via INTRAVENOUS

## 2018-10-27 MED ORDER — SODIUM CHLORIDE 0.9% FLUSH: 10.0000 mL | INTRAVENOUS | Status: DC | PRN

## 2018-10-27 MED ORDER — CHLORHEXIDINE GLUCONATE CLOTH 2 % EX PADS
6.0000 | MEDICATED_PAD | Freq: Every day | CUTANEOUS | Status: DC
  Administered 2018-10-29 – 2018-11-04 (×5): 6 via TOPICAL

## 2018-10-27 MED ORDER — MAGNESIUM SULFATE 2 GM/50ML IV SOLN
2.0000 g | Freq: Once | INTRAVENOUS | Status: AC
  Administered 2018-10-27: 2 g via INTRAVENOUS
  Filled 2018-10-27: qty 50

## 2018-10-27 NOTE — Progress Notes (Signed)
Nutrition Follow-up / Consult  DOCUMENTATION CODES:   Morbid obesity  INTERVENTION:    Vital High Protein at 40 ml/h (960 ml per day)   Pro-stat 30 ml BID   Provides 1160 kcal, 114 gm protein, 803 ml free water daily  NUTRITION DIAGNOSIS:   Inadequate oral intake related to inability to eat as evidenced by NPO status.  Ongoing   GOAL:   Provide needs based on ASPEN/SCCM guidelines  Being addressed with TF initiation today  MONITOR:   Vent status, Labs, Skin, I & O's  REASON FOR ASSESSMENT:   Consult Enteral/tube feeding initiation and management  ASSESSMENT:   58 yo female admitted with acute respiratory failure requiring intubation. PMH includes super morbid obesity, COPD, pulmonary HTN, OSA, HF.  Patient may require tracheostomy. Remains on vent. Received MD Consult for TF initiation and management. RN placing OG tube; patient pulled out the first one.  Temp (24hrs), Avg:99.3 F (37.4 C), Min:98.7 F (37.1 C), Max:99.8 F (37.7 C)   Labs reviewed. CBG's: [phone]  Medications reviewed and include Lasix, Solu-medrol, Novolog.   I/O net negative 4 L Weight down 5 lbs since yesterday   Diet Order:   Diet Order            Diet NPO time specified  Diet effective now              EDUCATION NEEDS:   Not appropriate for education at this time  Skin:  Skin Assessment: Reviewed RN Assessment  Last BM:  no BM documented  Height:   Ht Readings from Last 1 Encounters:  10/25/18 4\' 10"  (1.473 m)    Weight:   Wt Readings from Last 1 Encounters:  10/27/18 (!) 236.3 kg    Ideal Body Weight:  43.9 kg  BMI:  Body mass index is 108.89 kg/m.  Estimated Nutritional Needs:   Kcal:  [phone]  Protein:  110 gm  Fluid:  >/= 1.5 L    Molli Barrows, RD, LDN, [place] Pager [phone] After Hours Pager [phone]

## 2018-10-27 NOTE — Progress Notes (Signed)
Weaning ended due to excessive agitation and repsiratory rate >35.  RN at beside to give medications.  Pt resting comfortably once placed back on previous PRVC settings

## 2018-10-27 NOTE — Progress Notes (Signed)
[facility]   Reason for visit: Follow up on bacteremia  Interval History: CoNS and Enterococcus at OSH with sensitivities pending. No acute events.  Tried weaning but patient became agitated.  CXR with no signficant changes   Physical Exam: Constitutional:  Vitals:   10/27/18 0700 10/27/18 0842  BP:  (!) 155/81  Pulse:  85  Resp:  14  Temp: 98.7 F (37.1 C)   SpO2:  97%   patient appears in NAD, alert Eyes: anicteric HENT: +ET Respiratory: respiratory effort on vent; CTA B Cardiovascular: RRR GI: soft, nt, nd; morbidly obese  Review of Systems: Unable to be assessed due to patient factors  Lab Results  Component Value Date   WBC 7.7 10/27/2018   HGB 10.0 (L) 10/27/2018   HCT 34.7 (L) 10/27/2018   MCV 93.5 10/27/2018   PLT 170 10/27/2018    Lab Results  Component Value Date   CREATININE 1.53 (H) 10/27/2018   BUN 51 (H) 10/27/2018   NA 144 10/27/2018   K 4.4 10/27/2018   CL 102 10/27/2018   CO2 32 10/27/2018    Lab Results  Component Value Date   ALT 16 10/27/2018   AST 20 10/27/2018   ALKPHOS 74 10/27/2018     Microbiology: Recent Results (from the past 240 hour(s))  MRSA PCR Screening     Status: None   Collection Time: 10/25/18  2:28 AM   Specimen: Nasal Mucosa; Nasopharyngeal  Result Value Ref Range Status   MRSA by PCR NEGATIVE NEGATIVE Final    Comment:        The GeneXpert MRSA Assay (FDA approved for NASAL specimens only), is one component of a comprehensive MRSA colonization surveillance program. It is not intended to diagnose MRSA infection nor to guide or monitor treatment for MRSA infections. Performed at [facility], [address] [place], [place] [address]   Culture, respiratory (non-expectorated)     Status: None (Preliminary result)   Collection Time: 10/25/18  2:28 AM   Specimen: Tracheal Aspirate; Respiratory  Result Value Ref Range Status   Specimen Description TRACHEAL ASPIRATE  Final    Special Requests NONE  Final   Gram Stain   Final    FEW WBC PRESENT,BOTH PMN AND MONONUCLEAR RARE GRAM POSITIVE COCCI IN PAIRS    Culture   Final    RARE STAPHYLOCOCCUS AUREUS CULTURE REINCUBATED FOR BETTER GROWTH Performed at [facility], [address] [place], [place] [address]    Report Status PENDING  Incomplete  Culture, blood (routine x 2)     Status: None (Preliminary result)   Collection Time: 10/25/18  2:40 AM   Specimen: BLOOD LEFT HAND  Result Value Ref Range Status   Specimen Description BLOOD LEFT HAND  Final   Special Requests   Final    BOTTLES DRAWN AEROBIC ONLY Blood Culture adequate volume   Culture   Final    NO GROWTH 1 DAY Performed at [facility], [address] [place], [place] [address]    Report Status PENDING  Incomplete  SARS Coronavirus 2     Status: None   Collection Time: 10/25/18  2:48 AM  Result Value Ref Range Status   SARS Coronavirus 2 NOT DETECTED NOT DETECTED Final    Comment: (NOTE) SARS-CoV-2 target nucleic acids are NOT DETECTED. The SARS-CoV-2 RNA is generally detectable in upper and lower respiratory specimens during the acute phase of infection.  Negative  results do not preclude SARS-CoV-2 infection, do not rule out co-infections with other pathogens, and should not be used as the sole basis for treatment or other patient management decisions.  Negative results must be combined with clinical observations, patient history, and epidemiological information. The expected result is Not Detected. Fact Sheet for Patients: http://www.biofiredefense.com/wp-content/uploads/2020/03/BIOFIRE-COVID -19-patients.pdf Fact Sheet for Healthcare Providers: http://www.biofiredefense.com/wp-content/uploads/2020/03/BIOFIRE-COVID -19-hcp.pdf This test is not yet approved or cleared by the [place] and  has been authorized for detection and/or diagnosis of SARS-CoV-2 by FDA under an Emergency Use Authorization (EUA).  This  EUA will remain in effec t (meaning this test can be used) for the duration of  the COVID-19 declaration under Section 564(b)(1) of the Act, 21 U.S.C. section 360bbb-3(b)(1), unless the authorization is terminated or revoked sooner. Performed at [facility], [place] [address], [place], [place] [address]   Culture, blood (routine x 2)     Status: None (Preliminary result)   Collection Time: 10/25/18  2:50 AM   Specimen: BLOOD LEFT HAND  Result Value Ref Range Status   Specimen Description BLOOD LEFT HAND  Final   Special Requests   Final    BOTTLES DRAWN AEROBIC ONLY Blood Culture adequate volume   Culture   Final    NO GROWTH 1 DAY Performed at [facility], [place] [address], [place], [place] [address]    Report Status PENDING  Incomplete  Urine culture     Status: None   Collection Time: 10/25/18  5:03 AM   Specimen: Urine, Random  Result Value Ref Range Status   Specimen Description URINE, RANDOM  Final   Special Requests NONE  Final   Culture   Final    NO GROWTH Performed at [facility], [address] [place], [place] [address]    Report Status 10/26/2018 FINAL  Final    Impression/Plan:  1. Bacteremia - CoNS and Enterococcus.  On vancomycin pending sensitivities.  No fever and no leukocytosis.    2.  Respiratory failure - undergoing attempts at weaning.  Underlying COPD, obesity and chronic hypoxia.  Will continue to monitor  3.  Morbid obesity - will need efforts at weight loss after hospitalization

## 2018-10-27 NOTE — Progress Notes (Signed)
Update with daughter via phone, all questions answered,  She was able to provide patients baseline, both daughters provide full care of patient, patient is able to feed self and turn for them to place bedpan for elimination, she does sleep during the day and up all night, she refuses use of her CPAP but wears 5L [place] all day and night.

## 2018-10-27 NOTE — Progress Notes (Signed)
NAME:  Laura PilotRosa Stewart, MRN:  [id number], DOB:  07/12/1960, LOS: 2 ADMISSION DATE:  10/25/2018, CONSULTATION DATE:  10/25/2018 REFERRING MD:  [facility], CHIEF COMPLAINT:  Acute Respiratory failure  Brief History   [age] yoF [facility] with morbid obesity and acute on chronic hypoxic respiratory failure and UTI.   History of present illness   HPI obtained from medical chart review as patient is intubated and sedated on mechanical ventilation.   58 year old female with history of morbid obesity with BMI 110, chronic respiratory failure on 3L [place], COPD, Pulmonary hypertension, OSA, non-compliant with CPAP, and Diastolic HF transferred [facility] for acute on chronic respiratory failure.   She was found by EMS unresponsive at home with hypoxia in the 50's.  On arrival to [facility], she remained hypoxic on NRB with ABG showing pH 7.12, pCO2 125.7, PO2 44.3.  She was given lasix and required intubation.  She had tranisent hypotension on fentanyl and versed drips requiring levophed briefly.  CXR showed no infiltrate.  Workup noted for normal EKG with neg troponin, BNP 79, lower extremity ultrasound was negative for DVT, BMP showed mild hyperkalemia of 5.9 which was treated with kayexalate, otherwise normal.  UA showed moderate leukocytes with 6-10 WBC, WBC 9, normal procalcitonin, UDS negative,   Ammonia 41, normal LFTs, COVID negative.  Limited echo showed grade 2 diastolic dysfunction with EF 50-55%.  She was started on ceftriaxone for UTI and azithromycin for possible COPD exacerbation.   Blood cultures obtained showed GPC and enterococcus species and therefore started on vancomycin.   She was [facility] resources since patient's BMI exceeded their bariatric capabilities.  PCCM to accept.   Past Medical History  Chronic respiratory failure on 3L [place] O2 COPD Pulmonary hypertension OSA, non-compliant with CPAP Diastolic HF Former smoker   Significant Hospital Events   6/8 [facility] to [facility]  Consults:  6/9 id Procedures:  6/8  ETT >> 6/8  Foley >>  Significant Diagnostic Tests:  OHS- 6/8 BLE venous dopplers >> neg DVT            6/8 TTE- limited study, G2DD, EF 50-55%  Micro Data:  [place] 6/8 BC x 2 >> GPC and enterococcus species                     UC >>          COVID >> neg  6/9 BC x 2 >> 6/9 UC >> negative 6/9 trach asp >> 6/9 COVID >>neg  Antimicrobials:  6/8 azithro, ceftriaxone, vancomcyin 6/9 ceftriaxone >>6/10 6/9 vancomcyin >>6/10  Interim history/subjective:  Wake and follows commands failing spontaneous breathing trials. Objective   Blood pressure (!) 155/81, pulse 85, temperature 98.7 F (37.1 C), temperature source Oral, resp. rate 14, height 4\' 10"  (1.473 m), weight (!) 236.3 kg, SpO2 97 %.    Vent Mode: PSV;CPAP FiO2 (%):  [40 %] 40 % Set Rate:  [20 bmp] 20 bmp Vt Set:  [500 mL] 500 mL PEEP:  [8 cmH20] 8 cmH20 Pressure Support:  [12 cmH20] 12 cmH20 Plateau Pressure:  [21 cmH20-26 cmH20] 26 cmH20   Intake/Output Summary (Last 24 hours) at 10/27/2018 1020 Last data filed at 10/27/2018 0622 Gross per 24 hour  Intake 732.7 ml  Output 2525 ml  Net -1792.3 ml   Filed Weights   10/25/18 0400 10/26/18 0500 10/27/18 0500  Weight: (!) 239.5 kg (!) 238.6 kg Marland Kitchen(!)  236.3 kg    Examination: General: Super morbidly obese female HEENT: Endotracheal tube gastric tube in place Neuro: Intact moves all extremities follows commands CV: s1s2 rrr, no m/r/g PULM: even/non-labored, lungs bilaterally and is throughout WJ:XBJY, non-tender, bsx4 active  Extremities: warm/dry, difficult to tell secondary to body habitus edema  Skin: no rashes or lesions    Resolved Hospital Problem list    Assessment & Plan:   Acute on chronic respiratory failure on baseline 3L home O2 Possible AECOPD OSA w/non compliance of CPAP, pulmonary hypertension and probable component of OHS P:   Continue full mechanical ventilatory support t Daily attempt at weaning although I suspect she will need tracheostomy in the near future Dilators and steroids   A  Sepsis - UTI and BC at OHS showing GPC/ enterococcus P:  Continue vancomycin per ID Monitor culture data    Acute encephalopathy likely related to hypercarbia/ ammonia P:  Currently on Dilaudid drip  Awake alert follows commands communicates well  Diastolic HF  P:  Cardiac monitoring Systolic   At risk for AKI Hyperkalemia- 5.9- treated at OHS Lab Results  Component Value Date   CREATININE 1.53 (H) 10/27/2018   CREATININE 1.57 (H) 10/26/2018   CREATININE 1.41 (H) 10/25/2018   Recent Labs  Lab 10/25/18 0325 10/26/18 0414 10/27/18 0351  K 4.7 4.6 4.4    P:  Monitor replete electrolytes as needed Continue to monitor creatinine   Elevated ammonia w/ normal LFTs - s/p lactulose at OHS P:  Ammonia level has decreased   Morbid obesity with BMI 110 P:  Weight loss counseling in the future   Best practice:  Diet: NPO Pain/Anxiety/Delirium protocol (if indicated): Dilaudid drip VAP protocol (if indicated): yes DVT prophylaxis: heparin SQ GI prophylaxis: PPI Glucose control: CBG q 4, add SSI if > 180 Mobility: BR Code Status: Full  Family Communication: none at this time Disposition: ICU  Labs   CBC: Recent Labs  Lab 10/25/18 0236 10/25/18 0325 10/26/18 0414 10/27/18 0351  WBC  --  10.0 10.0 7.7  NEUTROABS  --  9.0*  --   --   HGB 10.9* 9.4* 10.0* 10.0*  HCT 32.0* 33.7* 35.6* 34.7*  MCV  --  97.4 95.4 93.5  PLT  --  170 177 782    Basic Metabolic Panel: Recent Labs  Lab 10/25/18 0236 10/25/18 0325 10/26/18 0414 10/27/18 0351  NA 142 139 141 144  K 4.4 4.7 4.6 4.4  CL  --  99 98 102  CO2  --  29 32 32  GLUCOSE  --  128* 132* 137*  BUN  --  35* 46* 51*  CREATININE  --  1.41* 1.57* 1.53*  CALCIUM  --  8.5* 8.6* 8.7*  MG  --  1.9 2.0 2.0  PHOS  --  2.1* 4.0 3.3   GFR:  Estimated Creatinine Clearance: 75.4 mL/min (A) (by C-G formula based on SCr of 1.53 mg/dL (H)). Recent Labs  Lab 10/25/18 0325 10/26/18 0414 10/27/18 0351  PROCALCITON <0.10  <0.10 <0.10  --   WBC 10.0 10.0 7.7    Liver Function Tests: Recent Labs  Lab 10/25/18 0325 10/26/18 0414 10/27/18 0351  AST 18 17 20   ALT 11 12 16   ALKPHOS 87 79 74  BILITOT 0.5 0.6 0.7  PROT 7.5 7.7 7.5  ALBUMIN 2.4* 2.6* 2.6*   No results for input(s): LIPASE, AMYLASE in the last 168 hours. Recent Labs  Lab 10/25/18 0325 10/25/18 1252  AMMONIA 36*  26    ABG    Component Value Date/Time   PHART 7.326 (L) 10/25/2018 0236   PCO2ART 70.1 (HH) 10/25/2018 0236   PO2ART 78.0 (L) 10/25/2018 0236   HCO3 36.6 (H) 10/25/2018 0236   TCO2 39 (H) 10/25/2018 0236   O2SAT 94.0 10/25/2018 0236     Coagulation Profile: No results for input(s): INR, PROTIME in the last 168 hours.  Cardiac Enzymes: No results for input(s): CKTOTAL, CKMB, CKMBINDEX, TROPONINI in the last 168 hours.  HbA1C: No results found for: HGBA1C  CBG: Recent Labs  Lab 10/26/18 1525 10/26/18 1954 10/26/18 2340 10/27/18 0359 10/27/18 0801  GLUCAP 135* 116* 118* 123* 145*     Critical care time: 30 mins   Brett CanalesSteve Auriel Kist ACNP [facility] P[facility] Pager [phone] till 1 pm If no answer page [phone] 10/27/2018, 10:20 AM

## 2018-10-27 NOTE — Progress Notes (Signed)
PCCM INTERVAL PROGRESS NOTE   Called to bedside for patient with severe agitation. On upper limit of dilaudid infusion and has received PRN versed as well. Rounding team concerned about extubation and contemplating tracheostomy, so will not pursue extubation tonight. Will order propofol so it can be weaned rapidly in the AM to allow for SBT.    Georgann Housekeeper, AGACNP-BC [place] Pager [phone] or [phone]  10/27/2018 10:18 PM

## 2018-10-27 NOTE — Progress Notes (Signed)
Peripherally Inserted Central Catheter/Midline Placement  The IV Nurse has discussed with the patient and/or persons authorized to consent for the patient, the purpose of this procedure and the potential benefits and risks involved with this procedure.  The benefits include less needle sticks, lab draws from the catheter, and the patient may be discharged home with the catheter. Risks include, but not limited to, infection, bleeding, blood clot (thrombus formation), and puncture of an artery; nerve damage and irregular heartbeat and possibility to perform a PICC exchange if needed/ordered by physician.  Alternatives to this procedure were also discussed.  Bard Power PICC patient education guide, fact sheet on infection prevention and patient information card has been provided to patient /or left at bedside.  Consent obtained via telephone from daughter due to altered mental status.  PICC/Midline Placement Documentation  PICC Triple Lumen 76/54/65 PICC Left Cephalic 46 cm 0 cm (Active)  Indication for Insertion or Continuance of Line Prolonged intravenous therapies 10/27/18 1642  Exposed Catheter (cm) 0 cm 10/27/18 1642  Site Assessment Clean;Dry;Intact 10/27/18 1642  Lumen #1 Status Flushed;Saline locked;Blood return noted 10/27/18 1642  Lumen #2 Status Flushed;Saline locked;Blood return noted 10/27/18 1642  Lumen #3 Status Flushed;Saline locked;Blood return noted 10/27/18 1642  Dressing Type Transparent 10/27/18 1642  Dressing Status Clean;Dry;Intact 10/27/18 1642  Dressing Intervention New dressing 10/27/18 1642  Dressing Change Due 11/03/18 10/27/18 Crandall, Nicolette Bang 10/27/2018, 4:43 PM

## 2018-10-28 ENCOUNTER — Inpatient Hospital Stay (HOSPITAL_COMMUNITY): Payer: Medicaid Other

## 2018-10-28 DIAGNOSIS — J9602 Acute respiratory failure with hypercapnia: Secondary | ICD-10-CM

## 2018-10-28 DIAGNOSIS — B952 Enterococcus as the cause of diseases classified elsewhere: Secondary | ICD-10-CM

## 2018-10-28 LAB — CBC
HCT: 33.7 % — ABNORMAL LOW (ref 36.0–46.0)
Hemoglobin: 9.7 g/dL — ABNORMAL LOW (ref 12.0–15.0)
MCH: 26.9 pg (ref 26.0–34.0)
MCHC: 28.8 g/dL — ABNORMAL LOW (ref 30.0–36.0)
MCV: 93.6 fL (ref 80.0–100.0)
Platelets: 168 10*3/uL (ref 150–400)
RBC: 3.6 MIL/uL — ABNORMAL LOW (ref 3.87–5.11)
RDW: 15.4 % (ref 11.5–15.5)
WBC: 5.8 10*3/uL (ref 4.0–10.5)
nRBC: 0.3 % — ABNORMAL HIGH (ref 0.0–0.2)

## 2018-10-28 LAB — TRIGLYCERIDES: Triglycerides: 142 mg/dL (ref <150)

## 2018-10-28 LAB — HEPATIC FUNCTION PANEL
ALT: 29 U/L (ref 0–44)
AST: 35 U/L (ref 15–41)
Albumin: 2.5 g/dL — ABNORMAL LOW (ref 3.5–5.0)
Alkaline Phosphatase: 63 U/L (ref 38–126)
Bilirubin, Direct: 0.1 mg/dL (ref 0.0–0.2)
Indirect Bilirubin: 0.5 mg/dL (ref 0.3–0.9)
Total Bilirubin: 0.6 mg/dL (ref 0.3–1.2)
Total Protein: 7 g/dL (ref 6.5–8.1)

## 2018-10-28 LAB — COMPREHENSIVE METABOLIC PANEL
ALT: 24 U/L (ref 0–44)
AST: 30 U/L (ref 15–41)
Albumin: 2.4 g/dL — ABNORMAL LOW (ref 3.5–5.0)
Alkaline Phosphatase: 58 U/L (ref 38–126)
Anion gap: 11 (ref 5–15)
BUN: 64 mg/dL — ABNORMAL HIGH (ref 6–20)
CO2: 32 mmol/L (ref 22–32)
Calcium: 8.6 mg/dL — ABNORMAL LOW (ref 8.9–10.3)
Chloride: 102 mmol/L (ref 98–111)
Creatinine, Ser: 1.61 mg/dL — ABNORMAL HIGH (ref 0.44–1.00)
GFR calc Af Amer: 40 mL/min — ABNORMAL LOW (ref >60)
GFR calc non Af Amer: 35 mL/min — ABNORMAL LOW (ref >60)
Glucose, Bld: 135 mg/dL — ABNORMAL HIGH (ref 70–99)
Potassium: 5 mmol/L (ref 3.5–5.1)
Sodium: 145 mmol/L (ref 135–145)
Total Bilirubin: 0.8 mg/dL (ref 0.3–1.2)
Total Protein: 7 g/dL (ref 6.5–8.1)

## 2018-10-28 LAB — PHOSPHORUS: Phosphorus: 3.9 mg/dL (ref 2.5–4.6)

## 2018-10-28 LAB — CULTURE, RESPIRATORY W GRAM STAIN

## 2018-10-28 LAB — GLUCOSE, CAPILLARY
Glucose-Capillary: 122 mg/dL — ABNORMAL HIGH (ref 70–99)
Glucose-Capillary: 122 mg/dL — ABNORMAL HIGH (ref 70–99)
Glucose-Capillary: 131 mg/dL — ABNORMAL HIGH (ref 70–99)
Glucose-Capillary: 134 mg/dL — ABNORMAL HIGH (ref 70–99)
Glucose-Capillary: 137 mg/dL — ABNORMAL HIGH (ref 70–99)
Glucose-Capillary: 176 mg/dL — ABNORMAL HIGH (ref 70–99)

## 2018-10-28 LAB — MAGNESIUM: Magnesium: 2.3 mg/dL (ref 1.7–2.4)

## 2018-10-28 MED ORDER — PROPOFOL 1000 MG/100ML IV EMUL
5.0000 ug/kg/min | INTRAVENOUS | Status: DC
  Administered 2018-10-28: 35 ug/kg/min via INTRAVENOUS
  Administered 2018-10-28: 20 ug/kg/min via INTRAVENOUS
  Administered 2018-10-28: 25 ug/kg/min via INTRAVENOUS
  Administered 2018-10-28 – 2018-10-29 (×2): 30 ug/kg/min via INTRAVENOUS
  Administered 2018-10-29 (×2): 35 ug/kg/min via INTRAVENOUS
  Administered 2018-10-29: 30 ug/kg/min via INTRAVENOUS
  Administered 2018-10-29: 20 ug/kg/min via INTRAVENOUS
  Administered 2018-10-29 (×2): 30 ug/kg/min via INTRAVENOUS
  Administered 2018-10-29: 40 ug/kg/min via INTRAVENOUS
  Administered 2018-10-29: 25 ug/kg/min via INTRAVENOUS
  Administered 2018-10-29: 20 ug/kg/min via INTRAVENOUS
  Administered 2018-10-30: 25 ug/kg/min via INTRAVENOUS
  Administered 2018-10-30 (×2): 30 ug/kg/min via INTRAVENOUS
  Administered 2018-10-30: 20 ug/kg/min via INTRAVENOUS
  Filled 2018-10-28 (×18): qty 100

## 2018-10-28 MED ORDER — PANTOPRAZOLE SODIUM 40 MG PO PACK
40.0000 mg | PACK | Freq: Every day | ORAL | Status: DC
  Administered 2018-10-28 – 2018-10-29 (×2): 40 mg
  Filled 2018-10-28 (×3): qty 20

## 2018-10-28 MED ORDER — ALPRAZOLAM 0.5 MG PO TABS
0.5000 mg | ORAL_TABLET | Freq: Three times a day (TID) | ORAL | Status: DC | PRN
  Administered 2018-10-28 – 2018-10-29 (×3): 0.5 mg via ORAL
  Filled 2018-10-28 (×3): qty 1

## 2018-10-28 MED ORDER — BUSPIRONE HCL 15 MG PO TABS
30.0000 mg | ORAL_TABLET | Freq: Two times a day (BID) | ORAL | Status: DC
  Administered 2018-10-28 – 2018-10-31 (×6): 30 mg
  Filled 2018-10-28 (×7): qty 2

## 2018-10-28 MED ORDER — SERTRALINE HCL 25 MG PO TABS
25.0000 mg | ORAL_TABLET | Freq: Every day | ORAL | Status: DC
  Administered 2018-10-28 – 2018-10-31 (×4): 25 mg
  Filled 2018-10-28 (×4): qty 1

## 2018-10-28 NOTE — Progress Notes (Addendum)
58 year old morbidly obese woman admitted from [facility] 6/9 with acute on chronic hypercarbic respiratory failure. She was found to have enterococcal bacteremia  Afebrile, critically ill, morbidly obese woman lying supine, very anxious, tries to speak through ET tube, decreased breath sounds bilaterally, distant heart sounds, cannot tell edema  Chest x-ray 6 last 12 personally reviewed which shows ET tube in position and increase in edema pattern with cardiomegaly.  Labs show mild hyperkalemia and rising BUN and creatinine, stable low hemoglobin.  Impression/plan  Acute on chronic hypercarbic respiratory failure-on 3 L oxygen at home Obesity hypoventilation syndrome -She does wean on pressure support 15/5 but does not tolerate lower pressure support, unclear if anxiety is playing a role -With her known poor compliance with CPAP in the past, tracheostomy is likely the best way forward.  I explained this to the patient and let her daughter Elmyra Ricks know. Daughter states that mom would never want trach & she will discuss with her sister ENT will be consulted  Enterococcal bacteremia-continue vancomycin  Acute encephalopathy-resolved, use propofol and Dilaudid for sedation, goal RA SS -1  AKI -good negative balance, continue Lasix 40 but watch BUN and creatinine closely  Severe anxiety - will resume zoloft & ad xanax 0.5 tid prn  The patient is critically ill with multiple organ systems failure and requires high complexity decision making for assessment and support, frequent evaluation and titration of therapies, application of advanced monitoring technologies and extensive interpretation of multiple databases. Critical Care Time devoted to patient care services described in this note independent of APP/resident  time is 33 minutes.   Leanna Sato Elsworth Soho MD

## 2018-10-28 NOTE — Progress Notes (Signed)
NAME:  Laura PilotRosa Stewart, MRN:  [id number], DOB:  02/21/1961, LOS: 3 ADMISSION DATE:  10/25/2018, CONSULTATION DATE:  10/25/2018 REFERRING MD:  [facility], CHIEF COMPLAINT:  Acute Respiratory failure  Brief History   [age] yoF [facility] with morbid obesity and acute on chronic hypoxic respiratory failure and UTI.   History of present illness   HPI obtained from medical chart review as patient is intubated and sedated on mechanical ventilation.   58 year old female with history of morbid obesity with BMI 110, chronic respiratory failure on 3L [place], COPD, Pulmonary hypertension, OSA, non-compliant with CPAP, and Diastolic HF transferred [facility] for acute on chronic respiratory failure.   She was found by EMS unresponsive at home with hypoxia in the 50's.  On arrival to [facility], she remained hypoxic on NRB with ABG showing pH 7.12, pCO2 125.7, PO2 44.3.  She was given lasix and required intubation.  She had tranisent hypotension on fentanyl and versed drips requiring levophed briefly.  CXR showed no infiltrate.  Workup noted for normal EKG with neg troponin, BNP 79, lower extremity ultrasound was negative for DVT, BMP showed mild hyperkalemia of 5.9 which was treated with kayexalate, otherwise normal.  UA showed moderate leukocytes with 6-10 WBC, WBC 9, normal procalcitonin, UDS negative,   Ammonia 41, normal LFTs, COVID negative.  Limited echo showed grade 2 diastolic dysfunction with EF 50-55%.  She was started on ceftriaxone for UTI and azithromycin for possible COPD exacerbation.   Blood cultures obtained showed GPC and enterococcus species and therefore started on vancomycin.   [facility] since patient's BMI exceeded their bariatric capabilities.  PCCM to accept.   Past Medical History  Chronic respiratory failure on 3L [place] O2 COPD Pulmonary hypertension OSA, non-compliant with CPAP Diastolic HF Former smoker   Significant Hospital Events   6/8 [facility] to [facility]  Consults:  6/9 id Procedures:  6/8  ETT >> 6/8  Foley >>  Significant Diagnostic Tests:  OHS- 6/8 BLE venous dopplers >> neg DVT            6/8 TTE- limited study, G2DD, EF 50-55%  Micro Data:  [place] 6/8 BC x 2 >> enterococcus facialis  coag negative staph                     UC >>neg          COVID >> neg  6/9 BC x 2 >> 6/9 UC >> negative 6/9 trach asp >> MSSA 6/9 COVID >>neg  Antimicrobials:  6/8 azithro, ceftriaxone, vancomcyin 6/9 ceftriaxone >>6/10 6/9 vancomcyin >>6/10  Interim history/subjective:  Required propofol during the night for agitation continues to fail daily weaning attempts Objective   Blood pressure (!) 162/84, pulse (!) 108, temperature 98.9 F (37.2 C), temperature source Axillary, resp. rate (!) 21, height 4\' 10"  (1.473 m), weight (!) 233.6 kg, SpO2 96 %.    Vent Mode: PRVC FiO2 (%):  [40 %] 40 % Set Rate:  [20 bmp] 20 bmp Vt Set:  [500 mL] 500 mL PEEP:  [8 cmH20] 8 cmH20 Pressure Support:  [12 cmH20] 12 cmH20 Plateau Pressure:  [22 cmH20-32 cmH20] 26 cmH20   Intake/Output Summary (Last 24 hours) at 10/28/2018 1039 Last data filed at 10/28/2018 0900 Gross per 24 hour  Intake 1692.71 ml  Output 4285 ml  Net -2592.29 ml   Filed Weights   10/26/18 0500 10/27/18 0500  10/28/18 0132  Weight: (!) 238.6 kg (!) 236.3 kg (!) 233.6 kg    Examination: General: Super morbidly obese female on full mechanical ventilatory support HEENT: Endotracheal and gastric tube in place Neuro: Agitated but follows commands CV: s1s2 rrr, no m/r/g PULM: even/non-labored, lungs bilaterally diminished throughout ZO:XWRUGI:soft, non-tender, bsx4 active  Extremities: warm/dry, unable to tell edema secondary to body habitus Skin: no rashes or lesions     Resolved Hospital Problem list    Assessment & Plan:   Acute on chronic respiratory failure on baseline 3L home O2 Possible AECOPD OSA w/non  compliance of CPAP, pulmonary hypertension and probable component of OHS P:  Currently on full mechanical ventilatory support.  Her body habitus prevents extubation at this time.  Agitation is also impedance extubation Resume home BuSpar   A  Sepsis - UTI and BC at OHS showing GPC/ enterococcus P:  Continue vancomycin per infectious disease Monitor culture data    Acute encephalopathy likely related to hypercarbia/ ammonia P:  Quired Dilaudid and propofol at night for agitation Currently propofol is off she remains agitated.  Diastolic HF  P:  Cardiac monitoring g As tolerated   At risk for AKI Hyperkalemia- 5.9- [facility] Lab Results  Component Value Date   CREATININE 1.61 (H) 10/28/2018   CREATININE 1.53 (H) 10/27/2018   CREATININE 1.57 (H) 10/26/2018   Recent Labs  Lab 10/26/18 0414 10/27/18 0351 10/28/18 0406  K 4.6 4.4 5.0    P:  Monitor replete electrolytes as needed Creatinine and BUN continue to rise with diuresis will monitor closely   Elevated ammonia w/ normal LFTs - s/p lactulose at OHS P:  Intermittent monitoring of LFTs   Morbid obesity with BMI 110 P:  Weight loss counseling in future   Best practice:  Diet: NPO Pain/Anxiety/Delirium protocol (if indicated): Dilaudid drip VAP protocol (if indicated): yes DVT prophylaxis: heparin SQ GI prophylaxis: PPI Glucose control: CBG q 4, add SSI if > 180 Mobility: BR Code Status: Full  Family Communication: none at this time,unable to reach daughter Disposition: ICU  Labs   CBC: Recent Labs  Lab 10/25/18 0236 10/25/18 0325 10/26/18 0414 10/27/18 0351 10/28/18 0406  WBC  --  10.0 10.0 7.7 5.8  NEUTROABS  --  9.0*  --   --   --   HGB 10.9* 9.4* 10.0* 10.0* 9.7*  HCT 32.0* 33.7* 35.6* 34.7* 33.7*  MCV  --  97.4 95.4 93.5 93.6  PLT  --  170 177 170 168    Basic Metabolic Panel: Recent Labs  Lab 10/25/18 0236 10/25/18 0325 10/26/18 0414 10/27/18 0351 10/28/18 0406  NA  142 139 141 144 145  K 4.4 4.7 4.6 4.4 5.0  CL  --  99 98 102 102  CO2  --  29 32 32 32  GLUCOSE  --  128* 132* 137* 135*  BUN  --  35* 46* 51* 64*  CREATININE  --  1.41* 1.57* 1.53* 1.61*  CALCIUM  --  8.5* 8.6* 8.7* 8.6*  MG  --  1.9 2.0 2.0 2.3  PHOS  --  2.1* 4.0 3.3 3.9   GFR: Estimated Creatinine Clearance: 71 mL/min (A) (by C-G formula based on SCr of 1.61 mg/dL (H)). Recent Labs  Lab 10/25/18 0325 10/26/18 0414 10/27/18 0351 10/28/18 0406  PROCALCITON <0.10  <0.10 <0.10  --   --   WBC 10.0 10.0 7.7 5.8    Liver Function Tests: Recent Labs  Lab 10/25/18 0325 10/26/18  9892 10/27/18 0351 10/28/18 0406  AST 18 17 20 30   ALT 11 12 16 24   ALKPHOS 87 79 74 58  BILITOT 0.5 0.6 0.7 0.8  PROT 7.5 7.7 7.5 7.0  ALBUMIN 2.4* 2.6* 2.6* 2.4*   No results for input(s): LIPASE, AMYLASE in the last 168 hours. Recent Labs  Lab 10/25/18 0325 10/25/18 1252  AMMONIA 36* 26    ABG    Component Value Date/Time   PHART 7.326 (L) 10/25/2018 0236   PCO2ART 70.1 (HH) 10/25/2018 0236   PO2ART 78.0 (L) 10/25/2018 0236   HCO3 36.6 (H) 10/25/2018 0236   TCO2 39 (H) 10/25/2018 0236   O2SAT 94.0 10/25/2018 0236     Coagulation Profile: No results for input(s): INR, PROTIME in the last 168 hours.  Cardiac Enzymes: No results for input(s): CKTOTAL, CKMB, CKMBINDEX, TROPONINI in the last 168 hours.  HbA1C: No results found for: HGBA1C  CBG: Recent Labs  Lab 10/27/18 1540 10/27/18 2001 10/27/18 2331 10/28/18 0345 10/28/18 0758  GLUCAP 123* 135* 117* 122* 137*     Critical care time: 30 mins   Richardson Landry Lulubelle Simcoe ACNP Maryanna Shape PCCM Pager [phone] till 1 pm If no answer page [phone] 10/28/2018, 10:39 AM

## 2018-10-28 NOTE — Progress Notes (Signed)
[facility] for Infectious Disease   Reason for visit: Follow up on bacteremia  Interval History: CoNS and Enterococcus at OSH with sensitivities pending, due today and to be sent over.  Now MSSA in respiratory culture. No acute events.  Tried weaning but patient became agitated again last night.  CXR with a little more edema.    Physical Exam: Constitutional:  Vitals:   10/28/18 0900 10/28/18 0909  BP: (!) 162/84   Pulse: (!) 108   Resp: (!) 21   Temp:    SpO2: 94% 96%   patient appears in NAD Eyes: anicteric HENT: +ET Respiratory: respiratory effort on vent; CTA B Cardiovascular: RRR GI: soft, nt, nd; morbidly obese  Review of Systems: Unable to be assessed due to patient factors  Lab Results  Component Value Date   WBC 5.8 10/28/2018   HGB 9.7 (L) 10/28/2018   HCT 33.7 (L) 10/28/2018   MCV 93.6 10/28/2018   PLT 168 10/28/2018    Lab Results  Component Value Date   CREATININE 1.61 (H) 10/28/2018   BUN 64 (H) 10/28/2018   NA 145 10/28/2018   K 5.0 10/28/2018   CL 102 10/28/2018   CO2 32 10/28/2018    Lab Results  Component Value Date   ALT 24 10/28/2018   AST 30 10/28/2018   ALKPHOS 58 10/28/2018     Microbiology: Recent Results (from the past 240 hour(s))  MRSA PCR Screening     Status: None   Collection Time: 10/25/18  2:28 AM   Specimen: Nasal Mucosa; Nasopharyngeal  Result Value Ref Range Status   MRSA by PCR NEGATIVE NEGATIVE Final    Comment:        The GeneXpert MRSA Assay (FDA approved for NASAL specimens only), is one component of a comprehensive MRSA colonization surveillance program. It is not intended to diagnose MRSA infection nor to guide or monitor treatment for MRSA infections. Performed at [facility], [place] [address] [place], [place] [address]   Culture, respiratory (non-expectorated)     Status: None   Collection Time: 10/25/18  2:28 AM   Specimen: Tracheal Aspirate; Respiratory  Result Value Ref Range Status    Specimen Description TRACHEAL ASPIRATE  Final   Special Requests NONE  Final   Gram Stain   Final    FEW WBC PRESENT,BOTH PMN AND MONONUCLEAR RARE GRAM POSITIVE COCCI IN PAIRS Performed at [facility], [place] [address] [place], [place] [address]    Culture RARE STAPHYLOCOCCUS AUREUS  Final   Report Status 10/28/2018 FINAL  Final   Organism ID, Bacteria STAPHYLOCOCCUS AUREUS  Final      Susceptibility   Staphylococcus aureus - MIC*    CIPROFLOXACIN <=0.5 SENSITIVE Sensitive     ERYTHROMYCIN >=8 RESISTANT Resistant     GENTAMICIN <=0.5 SENSITIVE Sensitive     OXACILLIN 0.5 SENSITIVE Sensitive     TETRACYCLINE <=1 SENSITIVE Sensitive     VANCOMYCIN <=0.5 SENSITIVE Sensitive     TRIMETH/SULFA <=10 SENSITIVE Sensitive     CLINDAMYCIN RESISTANT Resistant     RIFAMPIN <=0.5 SENSITIVE Sensitive     Inducible Clindamycin POSITIVE Resistant     * RARE STAPHYLOCOCCUS AUREUS  Culture, blood (routine x 2)     Status: None (Preliminary result)   Collection Time: 10/25/18  2:40 AM   Specimen: BLOOD LEFT HAND  Result Value Ref Range Status   Specimen Description BLOOD LEFT HAND  Final   Special Requests   Final  BOTTLES DRAWN AEROBIC ONLY Blood Culture adequate volume   Culture   Final    NO GROWTH 3 DAYS Performed at [facility], [address] [place], [place] [address]    Report Status PENDING  Incomplete  SARS Coronavirus 2     Status: None   Collection Time: 10/25/18  2:48 AM  Result Value Ref Range Status   SARS Coronavirus 2 NOT DETECTED NOT DETECTED Final    Comment: (NOTE) SARS-CoV-2 target nucleic acids are NOT DETECTED. The SARS-CoV-2 RNA is generally detectable in upper and lower respiratory specimens during the acute phase of infection.  Negative  results do not preclude SARS-CoV-2 infection, do not rule out co-infections with other pathogens, and should not be used as the sole basis for treatment or other patient management decisions.  Negative results  must be combined with clinical observations, patient history, and epidemiological information. The expected result is Not Detected. Fact Sheet for Patients: http://www.biofiredefense.com/wp-content/uploads/2020/03/BIOFIRE-COVID -19-patients.pdf Fact Sheet for Healthcare Providers: http://www.biofiredefense.com/wp-content/uploads/2020/03/BIOFIRE-COVID -19-hcp.pdf This test is not yet approved or cleared by the [place] FDA and  has been authorized for detection and/or diagnosis of SARS-CoV-2 by FDA under an Emergency Use Authorization (EUA).  This EUA will remain in effec t (meaning this test can be used) for the duration of  the COVID-19 declaration under Section 564(b)(1) of the Act, 21 U.S.C. section 360bbb-3(b)(1), unless the authorization is terminated or revoked sooner. Performed at [facility], [address] [place], [place] [address]   Culture, blood (routine x 2)     Status: None (Preliminary result)   Collection Time: 10/25/18  2:50 AM   Specimen: BLOOD LEFT HAND  Result Value Ref Range Status   Specimen Description BLOOD LEFT HAND  Final   Special Requests   Final    BOTTLES DRAWN AEROBIC ONLY Blood Culture adequate volume   Culture   Final    NO GROWTH 3 DAYS Performed at [facility], [address] [place], [place] [address]    Report Status PENDING  Incomplete  Urine culture     Status: None   Collection Time: 10/25/18  5:03 AM   Specimen: Urine, Random  Result Value Ref Range Status   Specimen Description URINE, RANDOM  Final   Special Requests NONE  Final   Culture   Final    NO GROWTH Performed at [facility], [address] [place], [place] [address]    Report Status 10/26/2018 FINAL  Final    Impression/Plan:  1. Bacteremia - CoNS and Enterococcus.  On vancomycin pending sensitivities.  No fever and no leukocytosis.  Will not need a prolonged course of antibiotics.    2.  Respiratory failure - undergoing attempts at  weaning.  Underlying COPD, obesity and chronic hypoxia.  MSSA in culture but no significant opacity, though difficult to know for sure with patients body habitus.  On treatment as above.    3.  Morbid obesity - will need efforts at weight loss after hospitalization

## 2018-10-29 ENCOUNTER — Inpatient Hospital Stay (HOSPITAL_COMMUNITY): Payer: Medicaid Other

## 2018-10-29 DIAGNOSIS — G9340 Encephalopathy, unspecified: Secondary | ICD-10-CM

## 2018-10-29 DIAGNOSIS — Z9119 Patient's noncompliance with other medical treatment and regimen: Secondary | ICD-10-CM

## 2018-10-29 DIAGNOSIS — J9691 Respiratory failure, unspecified with hypoxia: Secondary | ICD-10-CM

## 2018-10-29 DIAGNOSIS — G4733 Obstructive sleep apnea (adult) (pediatric): Secondary | ICD-10-CM

## 2018-10-29 LAB — BASIC METABOLIC PANEL
Anion gap: 10 (ref 5–15)
BUN: 74 mg/dL — ABNORMAL HIGH (ref 6–20)
CO2: 33 mmol/L — ABNORMAL HIGH (ref 22–32)
Calcium: 8.2 mg/dL — ABNORMAL LOW (ref 8.9–10.3)
Chloride: 101 mmol/L (ref 98–111)
Creatinine, Ser: 1.4 mg/dL — ABNORMAL HIGH (ref 0.44–1.00)
GFR calc Af Amer: 48 mL/min — ABNORMAL LOW (ref >60)
GFR calc non Af Amer: 41 mL/min — ABNORMAL LOW (ref >60)
Glucose, Bld: 161 mg/dL — ABNORMAL HIGH (ref 70–99)
Potassium: 3.8 mmol/L (ref 3.5–5.1)
Sodium: 144 mmol/L (ref 135–145)

## 2018-10-29 LAB — CBC WITH DIFFERENTIAL/PLATELET
Abs Immature Granulocytes: 0.08 10*3/uL — ABNORMAL HIGH (ref 0.00–0.07)
Basophils Absolute: 0 10*3/uL (ref 0.0–0.1)
Basophils Relative: 0 %
Eosinophils Absolute: 0 10*3/uL (ref 0.0–0.5)
Eosinophils Relative: 0 %
HCT: 33.4 % — ABNORMAL LOW (ref 36.0–46.0)
Hemoglobin: 9.5 g/dL — ABNORMAL LOW (ref 12.0–15.0)
Immature Granulocytes: 1 %
Lymphocytes Relative: 9 %
Lymphs Abs: 0.5 10*3/uL — ABNORMAL LOW (ref 0.7–4.0)
MCH: 26.9 pg (ref 26.0–34.0)
MCHC: 28.4 g/dL — ABNORMAL LOW (ref 30.0–36.0)
MCV: 94.6 fL (ref 80.0–100.0)
Monocytes Absolute: 0.4 10*3/uL (ref 0.1–1.0)
Monocytes Relative: 8 %
Neutro Abs: 4.8 10*3/uL (ref 1.7–7.7)
Neutrophils Relative %: 82 %
Platelets: 137 10*3/uL — ABNORMAL LOW (ref 150–400)
RBC: 3.53 MIL/uL — ABNORMAL LOW (ref 3.87–5.11)
RDW: 15.3 % (ref 11.5–15.5)
WBC: 5.8 10*3/uL (ref 4.0–10.5)
nRBC: 0 % (ref 0.0–0.2)

## 2018-10-29 LAB — GLUCOSE, CAPILLARY
Glucose-Capillary: 134 mg/dL — ABNORMAL HIGH (ref 70–99)
Glucose-Capillary: 109 mg/dL — ABNORMAL HIGH (ref 70–99)
Glucose-Capillary: 141 mg/dL — ABNORMAL HIGH (ref 70–99)
Glucose-Capillary: 128 mg/dL — ABNORMAL HIGH (ref 70–99)
Glucose-Capillary: 124 mg/dL — ABNORMAL HIGH (ref 70–99)
Glucose-Capillary: 152 mg/dL — ABNORMAL HIGH (ref 70–99)

## 2018-10-29 LAB — PHOSPHORUS: Phosphorus: 4.1 mg/dL (ref 2.5–4.6)

## 2018-10-29 LAB — MAGNESIUM: Magnesium: 2.5 mg/dL — ABNORMAL HIGH (ref 1.7–2.4)

## 2018-10-29 MED ORDER — ALPRAZOLAM 0.5 MG PO TABS: 0.5000 mg | ORAL_TABLET | Freq: Three times a day (TID) | ORAL | Status: DC | PRN

## 2018-10-29 NOTE — Progress Notes (Addendum)
NAME:  Laura Stewart, MRN:  [id number], DOB:  1961/04/04, LOS: 4 ADMISSION DATE:  10/25/2018, CONSULTATION DATE:  10/25/2018 REFERRING MD:  [facility], CHIEF COMPLAINT:  Acute Respiratory failure  Brief History   58 y/o F transferred from [facility] 6/9 with morbid obesity and acute on chronic hypoxic respiratory failure and UTI. COVID negative.   Past Medical History  Chronic respiratory failure on 3L [place] O2 COPD Pulmonary hypertension OSA, non-compliant with CPAP Diastolic HF Former smoker  [facility] Events   6/08 Admit to [facility] 6/09 Tx to [facility] 6/12 Required propofol overnight due to agitation  Consults:  6/09 ID  Procedures:  6/8  ETT >> 6/8  Foley >>  Significant Diagnostic Tests:  OHS- 6/8 BLE venous dopplers >> neg DVT           6/8 TTE- limited study, G2DD, EF 50-55%  Micro Data:  [place] 6/8 BC x 2 >> enterococcus facialis  coag negative staph                     UC >> neg          COVID >> neg  6/9 BC x 2 >> 6/9 UC >> negative 6/9 trach asp >> MSSA 6/9 COVID >> neg  Antimicrobials:  6/8 azithro, ceftriaxone, vancomcyin Ceftriaxone 6/9 >> 6/10 Vancomcyin 6/9 >> 6/10  Interim history/subjective:  RN reports pt remains on dilaudid and propofol gtts.  PVC's / frequent ectopy.   Objective   Blood pressure 130/65, pulse 72, temperature 97.7 F (36.5 C), temperature source Axillary, resp. rate 20, height 4\' 10"  (1.473 m), weight (!) 233.6 kg, SpO2 96 %.    Vent Mode: PRVC FiO2 (%):  [40 %] 40 % Set Rate:  [20 bmp] 20 bmp Vt Set:  [500 mL] 500 mL PEEP:  [8 cmH20] 8 cmH20 Plateau Pressure:  [22 cmH20-29 cmH20] 27 cmH20   Intake/Output Summary (Last 24 hours) at 10/29/2018 1008 Last data filed at 10/29/2018 0900 Gross per 24 hour  Intake 2435.77 ml  Output 2050 ml  Net 385.77 ml   Filed Weights   10/27/18 0500 10/28/18 0132 10/29/18 0431  Weight: (!) 236.3 kg (!) 233.6 kg (!) 233.6 kg    Examination: General: super  morbidly obese female lying in bed in NAD HEENT: MM pink/moist, ETT  Neuro: Awakens to voice, nods / interacts appropriately and follows commands CV: s1s2 rrr, no m/r/g PULM: even/non-labored, lungs bilaterally clear  JJ:KKXF, non-tender, bsx4 active  Extremities: warm/dry, difficult to assess for edema due to body habitus  Skin: no rashes or lesions  Resolved Hospital Problem list    Assessment & Plan:   Acute on Chronic Hypoxemic Respiratory Failure  -baseline 3L home O2 Possible AECOPD OSA w/non compliance of CPAP -pulmonary hypertension and probable component of OHS MSSA in Tracheal Aspirate  P:  PRVC 8cc/kg, rate 20 due to body habitus  Wean PEEP / FiO2 for sats >90% Would recommend trach due to body habitus but patient herself indicates she does not want one and her family is discussing the idea.  Follow intermittent CXR   Sepsis - UTI and BC at OHS showing GPC/ enterococcus P:  Continue abx as bove  Appreciate ID input  Follow culture data  Acute Encephalopathy  -suspect related to hypercarbia/ ammonia P:  Continue propofol, dilaudid gtts  Wean sedation as able  RASS GOAL: 0 to -1   Chronic Diastolic HF  P:  ICU monitoring Strict I/O's  At Risk AKI Hyperkalemia - 5.9, treated at OHS P:  Trend BMP / urinary output Replace electrolytes as indicated Avoid nephrotoxic agents, ensure adequate renal perfusion  Elevated ammonia w/ normal LFTs -s/p lactulose at OHS P:  Follow LFT's, ammonia intermittently   Super Morbid Obesity  -BMI 110 P:  Will need weight loss counseling if extubated Consider bariatric surgery consult as outpatient   Best practice:  Diet: NPO Pain/Anxiety/Delirium protocol (if indicated): In place VAP protocol (if indicated): yes DVT prophylaxis: heparin SQ GI prophylaxis: PPI Glucose control: CBG q 4, add SSI if > 180 Mobility: BR Code Status: Full  Family Communication: Called to update daughter via phone.  Unavailable but  son-in-law updated.  Will update daughters. Disposition: ICU  Labs   CBC: Recent Labs  Lab 10/25/18 0325 10/26/18 0414 10/27/18 0351 10/28/18 0406 10/29/18 0422  WBC 10.0 10.0 7.7 5.8 5.8  NEUTROABS 9.0*  --   --   --  4.8  HGB 9.4* 10.0* 10.0* 9.7* 9.5*  HCT 33.7* 35.6* 34.7* 33.7* 33.4*  MCV 97.4 95.4 93.5 93.6 94.6  PLT 170 177 170 168 137*    Basic Metabolic Panel: Recent Labs  Lab 10/25/18 0325 10/26/18 0414 10/27/18 0351 10/28/18 0406 10/29/18 0422  NA 139 141 144 145 144  K 4.7 4.6 4.4 5.0 3.8  CL 99 98 102 102 101  CO2 29 32 32 32 33*  GLUCOSE 128* 132* 137* 135* 161*  BUN 35* 46* 51* 64* 74*  CREATININE 1.41* 1.57* 1.53* 1.61* 1.40*  CALCIUM 8.5* 8.6* 8.7* 8.6* 8.2*  MG 1.9 2.0 2.0 2.3 2.5*  PHOS 2.1* 4.0 3.3 3.9 4.1   GFR: Estimated Creatinine Clearance: 81.6 mL/min (A) (by C-G formula based on SCr of 1.4 mg/dL (H)). Recent Labs  Lab 10/25/18 0325 10/26/18 0414 10/27/18 0351 10/28/18 0406 10/29/18 0422  PROCALCITON <0.10  <0.10 <0.10  --   --   --   WBC 10.0 10.0 7.7 5.8 5.8    Liver Function Tests: Recent Labs  Lab 10/25/18 0325 10/26/18 0414 10/27/18 0351 10/28/18 0406 10/28/18 1218  AST 18 17 20 30  35  ALT 11 12 16 24 29   ALKPHOS 87 79 74 58 63  BILITOT 0.5 0.6 0.7 0.8 0.6  PROT 7.5 7.7 7.5 7.0 7.0  ALBUMIN 2.4* 2.6* 2.6* 2.4* 2.5*   No results for input(s): LIPASE, AMYLASE in the last 168 hours. Recent Labs  Lab 10/25/18 0325 10/25/18 1252  AMMONIA 36* 26    ABG    Component Value Date/Time   PHART 7.326 (L) 10/25/2018 0236   PCO2ART 70.1 (HH) 10/25/2018 0236   PO2ART 78.0 (L) 10/25/2018 0236   HCO3 36.6 (H) 10/25/2018 0236   TCO2 39 (H) 10/25/2018 0236   O2SAT 94.0 10/25/2018 0236     Coagulation Profile: No results for input(s): INR, PROTIME in the last 168 hours.  Cardiac Enzymes: No results for input(s): CKTOTAL, CKMB, CKMBINDEX, TROPONINI in the last 168 hours.  HbA1C: No results found for: HGBA1C  CBG:  Recent Labs  Lab 10/28/18 1559 10/28/18 1936 10/28/18 2335 10/29/18 0337 10/29/18 0738  GLUCAP 131* 122* 134* 134* 109*     Critical care time: 30 mins         Attending Note:  58 year old female with super morbid obesity who presents to PCCM with acute on chronic hypercarbic respiratory failure.  No events overnight.  On exam, completely unresponsive and not following commands.  I reviewed CXR myself, ETT is  in a good position.  Discussed with PCCM-NP.  Will maintain on full vent support.  Will minimize sedation as able.  Replace electrolytes.  AM labs ordered.  Will need tracheostomy.  PCCM will continue to follow.  The patient is critically ill with multiple organ systems failure and requires high complexity decision making for assessment and support, frequent evaluation and titration of therapies, application of advanced monitoring technologies and extensive interpretation of multiple databases.   Critical Care Time devoted to patient care services described in this note is  32  Minutes. This time reflects time of care of this signee Dr Koren BoundWesam Tonny Isensee. This critical care time does not reflect procedure time, or teaching time or supervisory time of PA/NP/Med student/Med Resident etc but could involve care discussion time.  Alyson ReedyWesam G. Angelene Rome, M.D. [facility]/Critical [facility] Medicine. Pager: [phone]. After hours pager: [phone].

## 2018-10-29 NOTE — Progress Notes (Addendum)
[facility]   Reason for visit: Follow up on bacteremia  Interval History: CoNS and Enterococcus at OSH with sensitivities noted in pharmacy note.  MSSA in respiratory culture. No acute events.  Needs trach and family discussing.   Repeat blood cultures here are negative to date.     Physical Exam: Constitutional:  Vitals:   10/29/18 1300 10/29/18 1330  BP: (!) 98/47 (!) 96/52  Pulse: (!) 56 (!) 56  Resp: 20 20  Temp:    SpO2: 98% 97%   patient is sedated Eyes: anicteric HENT: +ET Respiratory: respiratory effort on vent; CTA B Cardiovascular: RRR GI: soft, nt, nd; morbidly obese  Review of Systems: Unable to be assessed due to patient factors  Lab Results  Component Value Date   WBC 5.8 10/29/2018   HGB 9.5 (L) 10/29/2018   HCT 33.4 (L) 10/29/2018   MCV 94.6 10/29/2018   PLT 137 (L) 10/29/2018    Lab Results  Component Value Date   CREATININE 1.40 (H) 10/29/2018   BUN 74 (H) 10/29/2018   NA 144 10/29/2018   K 3.8 10/29/2018   CL 101 10/29/2018   CO2 33 (H) 10/29/2018    Lab Results  Component Value Date   ALT 29 10/28/2018   AST 35 10/28/2018   ALKPHOS 63 10/28/2018     Microbiology: Recent Results (from the past 240 hour(s))  MRSA PCR Screening     Status: None   Collection Time: 10/25/18  2:28 AM   Specimen: Nasal Mucosa; Nasopharyngeal  Result Value Ref Range Status   MRSA by PCR NEGATIVE NEGATIVE Final    Comment:        The GeneXpert MRSA Assay (FDA approved for NASAL specimens only), is one component of a comprehensive MRSA colonization surveillance program. It is not intended to diagnose MRSA infection nor to guide or monitor treatment for MRSA infections. Performed at [facility], [place] [address], [place], [place] [address]   Culture, respiratory (non-expectorated)     Status: None   Collection Time: 10/25/18  2:28 AM   Specimen: Tracheal Aspirate; Respiratory  Result Value Ref Range Status   Specimen Description TRACHEAL ASPIRATE  Final   Special Requests NONE  Final   Gram Stain   Final    FEW WBC PRESENT,BOTH PMN AND MONONUCLEAR RARE GRAM POSITIVE COCCI IN PAIRS Performed at [facility], [place] [address], [place], [place] [address]    Culture RARE STAPHYLOCOCCUS AUREUS  Final   Report Status 10/28/2018 FINAL  Final   Organism ID, Bacteria STAPHYLOCOCCUS AUREUS  Final      Susceptibility   Staphylococcus aureus - MIC*    CIPROFLOXACIN <=0.5 SENSITIVE Sensitive     ERYTHROMYCIN >=8 RESISTANT Resistant     GENTAMICIN <=0.5 SENSITIVE Sensitive     OXACILLIN 0.5 SENSITIVE Sensitive     TETRACYCLINE <=1 SENSITIVE Sensitive     VANCOMYCIN <=0.5 SENSITIVE Sensitive     TRIMETH/SULFA <=10 SENSITIVE Sensitive     CLINDAMYCIN RESISTANT Resistant     RIFAMPIN <=0.5 SENSITIVE Sensitive     Inducible Clindamycin POSITIVE Resistant     * RARE STAPHYLOCOCCUS AUREUS  Culture, blood (routine x 2)     Status: None (Preliminary result)   Collection Time: 10/25/18  2:40 AM   Specimen: BLOOD LEFT HAND  Result Value Ref Range Status   Specimen Description BLOOD LEFT HAND  Final   Special Requests   Final    BOTTLES  DRAWN AEROBIC ONLY Blood Culture adequate volume   Culture   Final    NO GROWTH 4 DAYS Performed at [facility], [address] [place], [place] [address]    Report Status PENDING  Incomplete  SARS Coronavirus 2     Status: None   Collection Time: 10/25/18  2:48 AM  Result Value Ref Range Status   SARS Coronavirus 2 NOT DETECTED NOT DETECTED Final    Comment: (NOTE) SARS-CoV-2 target nucleic acids are NOT DETECTED. The SARS-CoV-2 RNA is generally detectable in upper and lower respiratory specimens during the acute phase of infection.  Negative  results do not preclude SARS-CoV-2 infection, do not rule out co-infections with other pathogens, and should not be used as the sole basis for treatment or other patient management decisions.  Negative results must  be combined with clinical observations, patient history, and epidemiological information. The expected result is Not Detected. Fact Sheet for Patients: http://www.biofiredefense.com/wp-content/uploads/2020/03/BIOFIRE-COVID -19-patients.pdf Fact Sheet for Healthcare Providers: http://www.biofiredefense.com/wp-content/uploads/2020/03/BIOFIRE-COVID -19-hcp.pdf This test is not yet approved or cleared by the [place] FDA and  has been authorized for detection and/or diagnosis of SARS-CoV-2 by FDA under an Emergency Use Authorization (EUA).  This EUA will remain in effec t (meaning this test can be used) for the duration of  the COVID-19 declaration under Section 564(b)(1) of the Act, 21 U.S.C. section 360bbb-3(b)(1), unless the authorization is terminated or revoked sooner. Performed at [facility], [address] [place], [place] [address]   Culture, blood (routine x 2)     Status: None (Preliminary result)   Collection Time: 10/25/18  2:50 AM   Specimen: BLOOD LEFT HAND  Result Value Ref Range Status   Specimen Description BLOOD LEFT HAND  Final   Special Requests   Final    BOTTLES DRAWN AEROBIC ONLY Blood Culture adequate volume   Culture   Final    NO GROWTH 4 DAYS Performed at [facility], [address] [place], [place] [address]    Report Status PENDING  Incomplete  Urine culture     Status: None   Collection Time: 10/25/18  5:03 AM   Specimen: Urine, Random  Result Value Ref Range Status   Specimen Description URINE, RANDOM  Final   Special Requests NONE  Final   Culture   Final    NO GROWTH Performed at [facility], [address] [place], [place] [address]    Report Status 10/26/2018 FINAL  Final    Impression/Plan:  1. Bacteremia - CoNS and Enterococcus.  No PM, no hardware.  Only one bottle with Enterococcus so I doubt it is significant.  On vancomycin to cover both.  No fever and no leukocytosis.  With repeat cultures negative here,  5 days total of vancomycin will be sufficient through June 14th.   2.  Respiratory failure - Needs trach but patient is resistant.  Underlying COPD, obesity and chronic hypoxia c/w obesity hypoventilation syndrome.  MSSA in culture but no significant opacity, though difficult to know for sure with patients body habitus.  On treatment as above through June 14th.   3.  OSA - non-compliant with CPAP at home.     I will sign off, call with questions.

## 2018-10-29 NOTE — Progress Notes (Signed)
Pharmacy Antibiotic Note  Laura Stewart is a 58 y.o. female admitted on 10/25/2018 with bacteremia from cultures taken at OSH prior to transfer.  Pharmacy has been consulted for vancomycin dosing.  Current Vancomycin dosing remains appropriate for now.   Vancomycin 1500 mg IV Q 24 hrs. Goal AUC 400-550. Expected AUC: 160 SCr used: 1.4   Plan: - Continue Vancomycin 1500 mg IV every 24 hours - Will obtain levels 6/14 - Will continue to follow renal function, culture results, LOT, and antibiotic de-escalation plans   Height: 4\' 10"  (147.3 cm) Weight: (!) 515 lb (233.6 kg) IBW/kg (Calculated) : 40.9  Temp (24hrs), Avg:98.3 F (36.8 C), Min:97.6 F (36.4 C), Max:99.4 F (37.4 C)  Recent Labs  Lab 10/25/18 0325 10/26/18 0414 10/27/18 0351 10/28/18 0406 10/29/18 0422  WBC 10.0 10.0 7.7 5.8 5.8  CREATININE 1.41* 1.57* 1.53* 1.61* 1.40*    Estimated Creatinine Clearance: 81.6 mL/min (A) (by C-G formula based on SCr of 1.4 mg/dL (H)).    Not on File  Antimicrobials this admission: OSH: azithromycin, ceftriaxone, vancomycin  Ceftriaxone 6/9 >> 6/9 Cefepime 6/9 >> 6/10 Ampicillin 6/9 >> 6/10 Vanc 6/10 >>  Dose adjustments this admission: None  Microbiology results: [place] micro data: - 6/8 BCx: CoNS 2 of 4 (S Bactrim, Vanc, Avelox) + 1/2 bottles E.faecalis (S Amp, Vanc) - 6/8 UCx: negative  - 6/8 sputum: negative  6/9 MRSA: neg 6/9 COVID: neg 6/9 BCx - NGTD 6/9 UCx - negative 6/9 TA - MSSA  Thank you for allowing pharmacy to be a part of this patient's care.  Alycia Rossetti, PharmD, BCPS Clinical Pharmacist Clinical phone for 10/29/2018: [phone] 10/29/2018 9:48 AM   **Pharmacist phone directory can now be found on [email or web address] ([email or web address]).  Listed under [place].

## 2018-10-30 LAB — BLOOD GAS, ARTERIAL
Acid-Base Excess: 9.3 mmol/L — ABNORMAL HIGH (ref 0.0–2.0)
Bicarbonate: 35.5 mmol/L — ABNORMAL HIGH (ref 20.0–28.0)
Drawn by: 398991
O2 Content: 2 L/min
O2 Saturation: 87.6 %
Patient temperature: 98.6
pCO2 arterial: 71.9 mmHg (ref 32.0–48.0)
pH, Arterial: 7.315 — ABNORMAL LOW (ref 7.350–7.450)
pO2, Arterial: 62 mmHg — ABNORMAL LOW (ref 83.0–108.0)

## 2018-10-30 LAB — BASIC METABOLIC PANEL
Anion gap: 12 (ref 5–15)
BUN: 84 mg/dL — ABNORMAL HIGH (ref 6–20)
CO2: 33 mmol/L — ABNORMAL HIGH (ref 22–32)
Calcium: 7.8 mg/dL — ABNORMAL LOW (ref 8.9–10.3)
Chloride: 101 mmol/L (ref 98–111)
Creatinine, Ser: 1.46 mg/dL — ABNORMAL HIGH (ref 0.44–1.00)
GFR calc Af Amer: 46 mL/min — ABNORMAL LOW (ref >60)
GFR calc non Af Amer: 39 mL/min — ABNORMAL LOW (ref >60)
Glucose, Bld: 156 mg/dL — ABNORMAL HIGH (ref 70–99)
Potassium: 4.2 mmol/L (ref 3.5–5.1)
Sodium: 146 mmol/L — ABNORMAL HIGH (ref 135–145)

## 2018-10-30 LAB — CBC
HCT: 33.2 % — ABNORMAL LOW (ref 36.0–46.0)
Hemoglobin: 9.4 g/dL — ABNORMAL LOW (ref 12.0–15.0)
MCH: 26.9 pg (ref 26.0–34.0)
MCHC: 28.3 g/dL — ABNORMAL LOW (ref 30.0–36.0)
MCV: 95.1 fL (ref 80.0–100.0)
Platelets: 139 10*3/uL — ABNORMAL LOW (ref 150–400)
RBC: 3.49 MIL/uL — ABNORMAL LOW (ref 3.87–5.11)
RDW: 15.3 % (ref 11.5–15.5)
WBC: 7.4 10*3/uL (ref 4.0–10.5)
nRBC: 0 % (ref 0.0–0.2)

## 2018-10-30 LAB — GLUCOSE, CAPILLARY
Glucose-Capillary: 152 mg/dL — ABNORMAL HIGH (ref 70–99)
Glucose-Capillary: 157 mg/dL — ABNORMAL HIGH (ref 70–99)
Glucose-Capillary: 142 mg/dL — ABNORMAL HIGH (ref 70–99)
Glucose-Capillary: 95 mg/dL (ref 70–99)
Glucose-Capillary: 122 mg/dL — ABNORMAL HIGH (ref 70–99)

## 2018-10-30 LAB — CULTURE, BLOOD (ROUTINE X 2)
Culture: NO GROWTH
Culture: NO GROWTH
Special Requests: ADEQUATE
Special Requests: ADEQUATE

## 2018-10-30 MED ORDER — ORAL CARE MOUTH RINSE
15.0000 mL | Freq: Two times a day (BID) | OROMUCOSAL | Status: DC
  Administered 2018-10-31 – 2018-11-04 (×6): 15 mL via OROMUCOSAL

## 2018-10-30 MED ORDER — FUROSEMIDE 10 MG/ML IJ SOLN
40.0000 mg | Freq: Four times a day (QID) | INTRAMUSCULAR | Status: DC
  Administered 2018-10-30 (×2): 40 mg via INTRAVENOUS
  Filled 2018-10-30 (×4): qty 4

## 2018-10-30 MED ORDER — SENNOSIDES 8.8 MG/5ML PO SYRP
5.0000 mL | ORAL_SOLUTION | Freq: Two times a day (BID) | ORAL | Status: DC
  Administered 2018-10-30: 5 mL

## 2018-10-30 MED ORDER — PANTOPRAZOLE SODIUM 40 MG IV SOLR
40.0000 mg | Freq: Two times a day (BID) | INTRAVENOUS | Status: DC
  Administered 2018-10-30 – 2018-11-04 (×10): 40 mg via INTRAVENOUS
  Filled 2018-10-30 (×9): qty 40

## 2018-10-30 MED ORDER — CHLORHEXIDINE GLUCONATE 0.12 % MT SOLN
15.0000 mL | Freq: Two times a day (BID) | OROMUCOSAL | Status: DC
  Administered 2018-10-31 – 2018-11-04 (×7): 15 mL via OROMUCOSAL
  Filled 2018-10-30 (×6): qty 15

## 2018-10-30 NOTE — Progress Notes (Signed)
Attempted swallow evaluation, pt was swallowing fine no signs of distress, or coughing. Approximately 10 mins after I gave pt some water to drink she coughed up a moderate about of thin clear fluid that looked like water. Order for swallow eval placed pt kept NPO. Will continue to monitor pt.

## 2018-10-30 NOTE — Progress Notes (Signed)
Attempted to give pt HHN. Pt took about 4 breaths of neb and then began to be agitated and threw mask off.  I attempted to place neb back on and tried to convince pt to take [facility], but pt refused and threw off mask and and started to kick her legs,  hitting the bed with her hands and crying saying "I won't take HHN until I have water".  No distress noted currently.  No increased WOB noted.

## 2018-10-30 NOTE — Progress Notes (Signed)
Spoke with Dr. Nelda Marseille about pt ABG results. Will continue to monitor pt.

## 2018-10-30 NOTE — Progress Notes (Signed)
Wasted 19ml of Dilaudid from dilaudid drip that was left over with Alfredia Client RN

## 2018-10-30 NOTE — Progress Notes (Signed)
Assisted daughter with camera/video time via elilnk

## 2018-10-30 NOTE — Progress Notes (Signed)
eLink Physician-Brief Progress Note Patient Name: Tiffannie Sloss DOB: [birth year] MRN: [id number]   Date of Service  10/30/2018  HPI/Events of Note  Patient extubated today and doing well however failed bedside swallow evaluation  eICU Interventions  Will switch protonix to IV. Other meds may be addressed in am     Intervention Category Minor Interventions: Routine modifications to care plan (e.g. PRN medications for pain, fever)  Shona Needles Antwone Capozzoli 10/30/2018, 10:01 PM

## 2018-10-30 NOTE — Procedures (Signed)
Extubation Procedure Note  Patient Details:   Name: Laura Stewart DOB: May 07, 1961 MRN: [id number]   Airway Documentation: no cuff leak on cuff leak test.  Dr Nelda Marseille aware and wants to proceed w/ extubation.   Vent end date: 10/30/18 Vent end time: 1230   Evaluation  O2 sats: stable throughout Complications: No apparent complications Patient did tolerate procedure well. Bilateral Breath Sounds: Diminished, Clear   Yes pt able to speak, no stridor noted. Pt denies SOB currently.  VSS.  [name] 10/30/2018, 12:34 PM

## 2018-10-30 NOTE — Progress Notes (Signed)
Assisted daughter with camea/video time via elink

## 2018-10-30 NOTE — Progress Notes (Addendum)
NAME:  Laura Stewart, MRN:  [id number], DOB:  03/20/1961, LOS: 5 ADMISSION DATE:  10/25/2018, CONSULTATION DATE:  10/25/2018 REFERRING MD:  [facility], CHIEF COMPLAINT:  Acute Respiratory failure  Brief History   58 y/o F [facility] 6/9 with morbid obesity and acute on chronic hypoxic respiratory failure and UTI. COVID negative.   Past Medical History  Chronic respiratory failure on 3L [place] O2 COPD Pulmonary hypertension OSA, non-compliant with CPAP Diastolic HF Former smoker  Significant Hospital Events   6/08 [facility] 6/09 Tx to [facility] 6/12 Required propofol overnight due to agitation 6/14 Weaning on PSV   Consults:  6/09 ID  Procedures:  6/8  ETT >> 6/8  Foley >>  Significant Diagnostic Tests:  OHS- 6/8 BLE venous dopplers >> neg DVT           6/8 TTE- limited study, G2DD, EF 50-55%  Micro Data:  [place] 6/8 BC x 2 >> enterococcus facialis  coag negative staph                     UC >> neg          COVID >> neg  6/9 BC x 2 >> negative 6/9 UC >> negative 6/9 trach asp >> MSSA 6/9 COVID >> neg  Antimicrobials:  6/8 azithro, ceftriaxone, vancomcyin Ceftriaxone 6/9 >> 6/10 Vancomcyin 6/9 >> 6/10  Interim history/subjective:  RN reports pt weaning on PSV.  Pt more alert / awake.  Afebrile.    Objective   Blood pressure (!) 153/81, pulse (!) 116, temperature (!) 97.2 F (36.2 C), temperature source Axillary, resp. rate (!) 21, height 4\' 10"  (1.473 m), weight (!) 234.5 kg, SpO2 99 %.    Vent Mode: PSV;CPAP FiO2 (%):  [40 %] 40 % Set Rate:  [20 bmp] 20 bmp Vt Set:  [500 mL] 500 mL PEEP:  [8 cmH20] 8 cmH20 Pressure Support:  [10 cmH20] 10 cmH20 Plateau Pressure:  [24 cmH20-25 cmH20] 24 cmH20   Intake/Output Summary (Last 24 hours) at 10/30/2018 1137 Last data filed at 10/30/2018 1000 Gross per 24 hour  Intake 2405.42 ml  Output 2550 ml  Net -144.58 ml   Filed Weights   10/28/18 0132 10/29/18 0431 10/30/18 0251  Weight:  (!) 233.6 kg (!) 233.6 kg (!) 234.5 kg    Examination: General: super morbidly obese female lying in bed on vent  HEENT: MM pink/moist, ETT Neuro: Awake/alert, generalized weakness but moves all extremities  CV: s1s2 rrr, no m/r/g PULM: even/non-labored, lungs bilaterally clear  LK:GMWNGI:soft, non-tender, bsx4 active  Extremities: warm/dry, unable to appreciate edema due to body habitus  Skin: no rashes or lesions  Resolved Hospital Problem list    Assessment & Plan:   Acute on Chronic Hypoxemic Respiratory Failure  -baseline 3L home O2 Possible AECOPD OSA w/non compliance of CPAP -pulmonary hypertension and probable component of OHS MSSA in Tracheal Aspirate  P:  PRVC 8cc/kg, keep rate of 20  Wean PEEP / FiO2 for sats > 90% Consider trach > patient indicated she would not want trach but yet to get in touch with family Follow CXR  Daily PSV as tolerated  Sepsis - UTI and BC at OHS showing GPC/ enterococcus P:  ABX as above  ID following  Follow cultures   Acute Encephalopathy  -suspect related to hypercarbia/ ammonia P:  Continue propofol, dilaudid gtt  Minimize sedation as able  RASS GOAL: 0 to -1   Chronic Diastolic HF  P:  ICU monitoring  Follow I/O's   At Risk AKI Hyperkalemia - 5.9, treated at OHS P:  Trend BMP / urinary output Replace electrolytes as indicated Avoid nephrotoxic agents, ensure adequate renal perfusion  Elevated ammonia w/ normal LFTs -s/p lactulose at OHS P:  Follow LFT's, ammonia intermittently   Super Morbid Obesity  -BMI 110 P:  Will need to consider bariatric consult as outpatient   Best practice:  Diet: NPO Pain/Anxiety/Delirium protocol (if indicated): In place VAP protocol (if indicated): yes DVT prophylaxis: heparin SQ GI prophylaxis: PPI Glucose control: CBG q 4, add SSI if > 180 Mobility: BR Code Status: Full  Family Communication: Will update when family returns call.  Disposition: ICU  Labs   CBC: Recent Labs   Lab 10/25/18 0325 10/26/18 0414 10/27/18 0351 10/28/18 0406 10/29/18 0422 10/30/18 0524  WBC 10.0 10.0 7.7 5.8 5.8 7.4  NEUTROABS 9.0*  --   --   --  4.8  --   HGB 9.4* 10.0* 10.0* 9.7* 9.5* 9.4*  HCT 33.7* 35.6* 34.7* 33.7* 33.4* 33.2*  MCV 97.4 95.4 93.5 93.6 94.6 95.1  PLT 170 177 170 168 137* 139*    Basic Metabolic Panel: Recent Labs  Lab 10/25/18 0325 10/26/18 0414 10/27/18 0351 10/28/18 0406 10/29/18 0422 10/30/18 0524  NA 139 141 144 145 144 146*  K 4.7 4.6 4.4 5.0 3.8 4.2  CL 99 98 102 102 101 101  CO2 29 32 32 32 33* 33*  GLUCOSE 128* 132* 137* 135* 161* 156*  BUN 35* 46* 51* 64* 74* 84*  CREATININE 1.41* 1.57* 1.53* 1.61* 1.40* 1.46*  CALCIUM 8.5* 8.6* 8.7* 8.6* 8.2* 7.8*  MG 1.9 2.0 2.0 2.3 2.5*  --   PHOS 2.1* 4.0 3.3 3.9 4.1  --    GFR: Estimated Creatinine Clearance: 78.4 mL/min (A) (by C-G formula based on SCr of 1.46 mg/dL (H)). Recent Labs  Lab 10/25/18 0325 10/26/18 0414 10/27/18 0351 10/28/18 0406 10/29/18 0422 10/30/18 0524  PROCALCITON <0.10  <0.10 <0.10  --   --   --   --   WBC 10.0 10.0 7.7 5.8 5.8 7.4    Liver Function Tests: Recent Labs  Lab 10/25/18 0325 10/26/18 0414 10/27/18 0351 10/28/18 0406 10/28/18 1218  AST 18 17 20 30  35  ALT 11 12 16 24 29   ALKPHOS 87 79 74 58 63  BILITOT 0.5 0.6 0.7 0.8 0.6  PROT 7.5 7.7 7.5 7.0 7.0  ALBUMIN 2.4* 2.6* 2.6* 2.4* 2.5*   No results for input(s): LIPASE, AMYLASE in the last 168 hours. Recent Labs  Lab 10/25/18 0325 10/25/18 1252  AMMONIA 36* 26    ABG    Component Value Date/Time   PHART 7.326 (L) 10/25/2018 0236   PCO2ART 70.1 (HH) 10/25/2018 0236   PO2ART 78.0 (L) 10/25/2018 0236   HCO3 36.6 (H) 10/25/2018 0236   TCO2 39 (H) 10/25/2018 0236   O2SAT 94.0 10/25/2018 0236     Coagulation Profile: No results for input(s): INR, PROTIME in the last 168 hours.  Cardiac Enzymes: No results for input(s): CKTOTAL, CKMB, CKMBINDEX, TROPONINI in the last 168 hours.   HbA1C: No results found for: HGBA1C  CBG: Recent Labs  Lab 10/29/18 1949 10/29/18 2309 10/30/18 0404 10/30/18 0744 10/30/18 1134  GLUCAP 124* 152* 152* 157* 142*     Critical care time: 30 mins      Canary BrimBrandi Ollis, NP-C [facility] Pgr: [phone] or if no answer [phone] 10/30/2018, 11:44  AM  Attending Note:  58 year old female that is super morbidly obese female with acute respiratory failure who presents with UTI and AMS.  COVID negative.  No events overnight, weaning this AM.  On exam, patient is weaning this AM with distant BS diffusely.  I reviewed CXR myself, ETT ok and infiltrate noted.  Discussed with PCCM-NP.  Attempted to call daughter x2 with no response.  Will extubate today.  If fails then will reintubate and trach.  Keep fluid negative as able.  Lasix as ordered.  PCCM will continue to follow.  The patient is critically ill with multiple organ systems failure and requires high complexity decision making for assessment and support, frequent evaluation and titration of therapies, application of advanced monitoring technologies and extensive interpretation of multiple databases.   Critical Care Time devoted to patient care services described in this note is  32  Minutes. This time reflects time of care of this signee Dr Jennet Maduro. This critical care time does not reflect procedure time, or teaching time or supervisory time of PA/NP/Med student/Med Resident etc but could involve care discussion time.  Rush Farmer, M.D. [facility]monary/Critical Care Medicine. Pager: [phone]. After hours pager: [phone].

## 2018-10-31 ENCOUNTER — Inpatient Hospital Stay (HOSPITAL_COMMUNITY): Payer: Medicaid Other

## 2018-10-31 LAB — POCT I-STAT 7, (LYTES, BLD GAS, ICA,H+H)
Acid-Base Excess: 12 mmol/L — ABNORMAL HIGH (ref 0.0–2.0)
Bicarbonate: 41.1 mmol/L — ABNORMAL HIGH (ref 20.0–28.0)
Calcium, Ion: 1.09 mmol/L — ABNORMAL LOW (ref 1.15–1.40)
HCT: 40 % (ref 36.0–46.0)
Hemoglobin: 13.6 g/dL (ref 12.0–15.0)
O2 Saturation: 97 %
Patient temperature: 98.6
Potassium: 4.5 mmol/L (ref 3.5–5.1)
Sodium: 142 mmol/L (ref 135–145)
TCO2: 43 mmol/L — ABNORMAL HIGH (ref 22–32)
pCO2 arterial: 71.3 mmHg (ref 32.0–48.0)
pH, Arterial: 7.368 (ref 7.350–7.450)
pO2, Arterial: 99 mmHg (ref 83.0–108.0)

## 2018-10-31 LAB — GLUCOSE, CAPILLARY
Glucose-Capillary: 100 mg/dL — ABNORMAL HIGH (ref 70–99)
Glucose-Capillary: 107 mg/dL — ABNORMAL HIGH (ref 70–99)
Glucose-Capillary: 113 mg/dL — ABNORMAL HIGH (ref 70–99)
Glucose-Capillary: 113 mg/dL — ABNORMAL HIGH (ref 70–99)
Glucose-Capillary: 126 mg/dL — ABNORMAL HIGH (ref 70–99)
Glucose-Capillary: 128 mg/dL — ABNORMAL HIGH (ref 70–99)
Glucose-Capillary: 164 mg/dL — ABNORMAL HIGH (ref 70–99)

## 2018-10-31 LAB — BASIC METABOLIC PANEL
Anion gap: 10 (ref 5–15)
BUN: 87 mg/dL — ABNORMAL HIGH (ref 6–20)
CO2: 39 mmol/L — ABNORMAL HIGH (ref 22–32)
Calcium: 7.8 mg/dL — ABNORMAL LOW (ref 8.9–10.3)
Chloride: 100 mmol/L (ref 98–111)
Creatinine, Ser: 1.47 mg/dL — ABNORMAL HIGH (ref 0.44–1.00)
GFR calc Af Amer: 45 mL/min — ABNORMAL LOW (ref >60)
GFR calc non Af Amer: 39 mL/min — ABNORMAL LOW (ref >60)
Glucose, Bld: 132 mg/dL — ABNORMAL HIGH (ref 70–99)
Potassium: 4.6 mmol/L (ref 3.5–5.1)
Sodium: 149 mmol/L — ABNORMAL HIGH (ref 135–145)

## 2018-10-31 LAB — CBC
HCT: 36.9 % (ref 36.0–46.0)
Hemoglobin: 10.5 g/dL — ABNORMAL LOW (ref 12.0–15.0)
MCH: 27.6 pg (ref 26.0–34.0)
MCHC: 28.5 g/dL — ABNORMAL LOW (ref 30.0–36.0)
MCV: 96.9 fL (ref 80.0–100.0)
Platelets: 152 10*3/uL (ref 150–400)
RBC: 3.81 MIL/uL — ABNORMAL LOW (ref 3.87–5.11)
RDW: 15.4 % (ref 11.5–15.5)
WBC: 8.6 10*3/uL (ref 4.0–10.5)
nRBC: 0 % (ref 0.0–0.2)

## 2018-10-31 LAB — MAGNESIUM: Magnesium: 2.4 mg/dL (ref 1.7–2.4)

## 2018-10-31 LAB — PHOSPHORUS: Phosphorus: 5.4 mg/dL — ABNORMAL HIGH (ref 2.5–4.6)

## 2018-10-31 MED ORDER — TOPIRAMATE 25 MG PO TABS
50.0000 mg | ORAL_TABLET | Freq: Every day | ORAL | Status: DC
  Administered 2018-11-01 – 2018-11-03 (×3): 50 mg via ORAL
  Filled 2018-10-31 (×5): qty 2

## 2018-10-31 MED ORDER — SERTRALINE HCL 25 MG PO TABS
25.0000 mg | ORAL_TABLET | Freq: Every day | ORAL | Status: DC
  Administered 2018-11-01 – 2018-11-04 (×4): 25 mg via ORAL
  Filled 2018-10-31 (×4): qty 1

## 2018-10-31 MED ORDER — IPRATROPIUM-ALBUTEROL 0.5-2.5 (3) MG/3ML IN SOLN
3.0000 mL | Freq: Three times a day (TID) | RESPIRATORY_TRACT | Status: DC
  Administered 2018-11-01 – 2018-11-03 (×7): 3 mL via RESPIRATORY_TRACT
  Filled 2018-10-31 (×7): qty 3

## 2018-10-31 MED ORDER — BUSPIRONE HCL 5 MG PO TABS
30.0000 mg | ORAL_TABLET | Freq: Two times a day (BID) | ORAL | Status: DC
  Filled 2018-10-31 (×4): qty 6
  Filled 2018-10-31: qty 2

## 2018-10-31 MED ORDER — ENSURE MAX PROTEIN PO LIQD
11.0000 [oz_av] | Freq: Every day | ORAL | Status: DC
  Administered 2018-10-31 – 2018-11-03 (×3): 11 [oz_av] via ORAL
  Filled 2018-10-31 (×5): qty 330

## 2018-10-31 MED ORDER — LORAZEPAM 2 MG/ML IJ SOLN
0.5000 mg | INTRAMUSCULAR | Status: DC | PRN
  Administered 2018-10-31: 0.5 mg via INTRAVENOUS
  Filled 2018-10-31 (×2): qty 1

## 2018-10-31 MED ORDER — ADULT MULTIVITAMIN W/MINERALS CH
1.0000 | ORAL_TABLET | Freq: Every day | ORAL | Status: DC
  Administered 2018-10-31 – 2018-11-04 (×5): 1 via ORAL
  Filled 2018-10-31 (×4): qty 1

## 2018-10-31 MED ORDER — FUROSEMIDE 40 MG PO TABS
40.0000 mg | ORAL_TABLET | Freq: Every day | ORAL | Status: DC
  Administered 2018-10-31 – 2018-11-04 (×5): 40 mg via ORAL
  Filled 2018-10-31 (×5): qty 1

## 2018-10-31 MED ORDER — POLYETHYLENE GLYCOL 3350 17 G PO PACK: 17.0000 g | PACK | Freq: Every day | ORAL | Status: DC | PRN

## 2018-10-31 MED ORDER — DOCUSATE SODIUM 50 MG/5ML PO LIQD
100.0000 mg | Freq: Two times a day (BID) | ORAL | Status: DC
  Administered 2018-10-31 – 2018-11-02 (×4): 100 mg via ORAL
  Filled 2018-10-31 (×4): qty 10

## 2018-10-31 MED ORDER — SENNOSIDES 8.8 MG/5ML PO SYRP
5.0000 mL | ORAL_SOLUTION | Freq: Two times a day (BID) | ORAL | Status: DC
  Administered 2018-10-31 – 2018-11-02 (×4): 5 mL via ORAL
  Filled 2018-10-31 (×4): qty 5

## 2018-10-31 NOTE — Discharge Instructions (Signed)
Fungal Nail Infection A fungal nail infection is a common infection of the toenails or fingernails. This condition affects toenails more often than fingernails. It often affects the great, or big, toes. More than one nail may be infected. The condition can be passed from person to person (is contagious). What are the causes? This condition is caused by a fungus. Several types of fungi can cause the infection. These fungi are common in moist and warm areas. If your hands or feet come into contact with the fungus, it may get into a crack in your fingernail or toenail and cause the infection. What increases the risk? The following factors may make you more likely to develop this condition:  Being female.  Being of older age.  Living with someone who has the fungus.  Walking barefoot in areas where the fungus thrives, such as showers or locker rooms.  Wearing shoes and socks that cause your feet to sweat.  Having a nail injury or a recent nail surgery.  Having certain medical conditions, such as: ? Athlete's foot. ? Diabetes. ? Psoriasis. ? Poor circulation. ? A weak body defense system (immune system). What are the signs or symptoms? Symptoms of this condition include:  A pale spot on the nail.  Thickening of the nail.  A nail that becomes yellow or brown.  A brittle or ragged nail edge.  A crumbling nail.  A nail that has lifted away from the nail bed. How is this diagnosed? This condition is diagnosed with a physical exam. Your health care provider may take a scraping or clipping from your nail to test for the fungus. How is this treated? Treatment is not needed for mild infections. If you have significant nail changes, treatment may include:  Antifungal medicines taken by mouth (orally). You may need to take the medicine for several weeks or several months, and you may not see the results for a long time. These medicines can cause side effects. Ask your health care provider  what problems to watch for.  Antifungal nail polish or nail cream. These may be used along with oral antifungal medicines.  Laser treatment of the nail.  Surgery to remove the nail. This may be needed for the most severe infections. It can take a long time, usually up to a year, for the infection to go away. The infection may also come back. Follow these instructions at home: Medicines  Take or apply over-the-counter and prescription medicines only as told by your health care provider.  Ask your health care provider about using over-the-counter mentholated ointment on your nails. Nail care  Trim your nails often.  Wash and dry your hands and feet every day.  Keep your feet dry: ? Wear absorbent socks, and change your socks frequently. ? Wear shoes that allow air to circulate, such as sandals or canvas tennis shoes. Throw out old shoes.  Do not use artificial nails.  If you go to a nail salon, make sure you choose one that uses clean instruments.  Use antifungal foot powder on your feet and in your shoes. General instructions  Do not share personal items, such as towels or nail clippers.  Do not walk barefoot in shower rooms or locker rooms.  Wear rubber gloves if you are working with your hands in wet areas.  Keep all follow-up visits as told by your health care provider. This is important. Contact a health care provider if: Your infection is not getting better or it is getting worse   after several months. Summary  A fungal nail infection is a common infection of the toenails or fingernails.  Treatment is not needed for mild infections. If you have significant nail changes, treatment may include taking medicine orally and applying medicine to your nails.  It can take a long time, usually up to a year, for the infection to go away. The infection may also come back.  Take or apply over-the-counter and prescription medicines only as told by your health care  provider.  Follow instructions for taking care of your nails to help prevent infection from coming back or spreading. This information is not intended to replace advice given to you by your health care provider. Make sure you discuss any questions you have with your health care provider. Document Released: 05/01/2000 Document Revised: 10/08/2017 Document Reviewed: 10/08/2017 Elsevier Interactive Patient Education  2019 Elsevier Inc.  

## 2018-10-31 NOTE — Progress Notes (Addendum)
Nutrition Follow-up  DOCUMENTATION CODES:   Morbid obesity  INTERVENTION:    Ensure Max po BID, each supplement provides 150 kcal and 30 grams of protein.    Multivitamin daily  NUTRITION DIAGNOSIS:   Inadequate oral intake related to inability to eat as evidenced by NPO status.  Ongoing, diet just advanced to full liquids.  GOAL:   Patient will meet greater than or equal to 90% of their needs  progressing  MONITOR:   Diet advancement, PO intake, Supplement acceptance, Skin  ASSESSMENT:   58 yo female admitted with acute respiratory failure requiring intubation. PMH includes super morbid obesity, COPD, pulmonary HTN, OSA, HF.  Extubated 6/14. SLP following for dysphagia. Diet just advanced to full liquids this morning.    Labs reviewed. Sodium 149 (H), BUN 87 (H), creatinine 1.47 (H), phosphorus 5.4 (H) CBG's: [phone] Medications reviewed and include Colace, Lasix, Novolog, Solu-medrol.    Diet Order:   Diet Order            Diet full liquid Room service appropriate? Yes; Fluid consistency: Thin  Diet effective now              EDUCATION NEEDS:   Not appropriate for education at this time  Skin:  Skin Assessment: Reviewed RN Assessment  Last BM:  no BM documented  Height:   Ht Readings from Last 1 Encounters:  10/25/18 4\' 10"  (1.473 m)    Weight:   Wt Readings from Last 1 Encounters:  10/31/18 (!) 228.2 kg    Ideal Body Weight:  43.9 kg  BMI:  Body mass index is 105.13 kg/m.  Estimated Nutritional Needs:   Kcal:  2500  Protein:  110 gm  Fluid:  2.5 L    Molli Barrows, RD, LDN, [place] Pager [phone] After Hours Pager [phone]

## 2018-10-31 NOTE — Progress Notes (Signed)
NAME:  Laura PilotRosa Stewart, MRN:  [id number], DOB:  12/26/1960, LOS: 6 ADMISSION DATE:  10/25/2018, CONSULTATION DATE:  10/25/2018 REFERRING MD:  [facility], CHIEF COMPLAINT:  Acute Respiratory failure  Brief History   58 y/o F [facility] 6/9 with morbid obesity and acute on chronic hypoxic respiratory failure and UTI. COVID negative.   Past Medical History  Chronic respiratory failure on 3L [place] O2 COPD Pulmonary hypertension OSA, non-compliant with CPAP Diastolic HF Former smoker  Significant Hospital Events   6/08 [facility] 6[facility]12 Required propofol overnight due to agitation 6/14 Weaning on PSV  -extubated  Consults:  6/09 ID  Procedures:  6/8  ETT >> 6/8  Foley >>  Significant Diagnostic Tests:  OHS- 6/8 BLE venous dopplers >> neg DVT           6/8 TTE- limited study, G2DD, EF 50-55%  Micro Data:  [place] 6/8 BC x 2 >> enterococcus facialis  coag negative staph                     UC >> neg          COVID >> neg  6/9 BC x 2 >> negative 6/9 UC >> negative 6/9 trach asp >> MSSA 6/9 COVID >> neg  Antimicrobials:  6/8 azithro, ceftriaxone, vancomcyin Ceftriaxone 6/9 >> 6/10 Vancomcyin 6/9 >> 6/10  Interim history/subjective:    6/15 - remains extubated. She tells me that permanent trach was offered to her in past but she declined. Used BiPAP for fe whours only. Voice still hoarse. Seen by speech -> full liquid diet with meds. PAtient extpressing that daughter is not the POA thought she lives with daughter - per social work notes. Reports being bed bound at baseline. On 5L [place] - apparently baseline   Objective   Blood pressure (!) 144/76, pulse 83, temperature 98.5 F (36.9 C), temperature source Oral, resp. rate (!) 23, height 4\' 10"  (1.473 m), weight (!) 228.2 kg, SpO2 100 %.    Vent Mode: BIPAP FiO2 (%):  [40 %] 40 % PEEP:  [5 cmH20-6 cmH20] 6 cmH20 Pressure Support:  [5 cmH20-6 cmH20] 6 cmH20   Intake/Output  Summary (Last 24 hours) at 10/31/2018 1059 Last data filed at 10/31/2018 0600 Gross per 24 hour  Intake 718.99 ml  Output 5185 ml  Net -4466.01 ml   Filed Weights   10/29/18 0431 10/30/18 0251 10/31/18 0500  Weight: (!) 233.6 kg (!) 234.5 kg (!) 228.2 kg    General Appearance:  Chronic deconditioned. OBESE + Head:  Normocephalic, without obvious abnormality, atraumatic Eyes:  PERRL - yes, conjunctiva/corneas - muddy     Ears:  Normal external ear canals, both ears Nose:  G tube - no - Ha[place] 5L [place] - this is baseline Throat:  ETT TUBE - no , OG tube - no Neck:  Supple,  No enlargement/tenderness/nodules Lungs: Clear to auscultation bilaterally,  Heart:  S1 and S2 normal, no murmur, CVP - no.  Pressors - no Abdomen:  Soft, no masses, no organomegaly Genitalia / Rectal:  Not done Extremities:  Extremities- intact, chronic edema Skin:  ntact in exposed areas . Sacral area - not examined Neurologic:  Sedation - none -> RASS - +! Marland Kitchen. Moves all 4s - yes. CAM-ICU - neg . Orientation - x3+      LABS    PULMONARY Recent Labs  Lab 10/25/18 0236 10/30/18 1445  PHART 7.326* 7.315*  PCO2ART 70.1* 71.9*  PO2ART 78.0* 62.0*  HCO3 36.6* 35.5*  TCO2 39*  --   O2SAT 94.0 87.6    CBC Recent Labs  Lab 10/29/18 0422 10/30/18 0524 10/31/18 0619  HGB 9.5* 9.4* 10.5*  HCT 33.4* 33.2* 36.9  WBC 5.8 7.4 8.6  PLT 137* 139* 152    COAGULATION No results for input(s): INR in the last 168 hours.  CARDIAC  No results for input(s): TROPONINI in the last 168 hours. No results for input(s): PROBNP in the last 168 hours.   CHEMISTRY Recent Labs  Lab 10/26/18 0414 10/27/18 0351 10/28/18 0406 10/29/18 0422 10/30/18 0524 10/31/18 0619  NA 141 144 145 144 146* 149*  K 4.6 4.4 5.0 3.8 4.2 4.6  CL 98 102 102 101 101 100  CO2 32 32 32 33* 33* 39*  GLUCOSE 132* 137* 135* 161* 156* 132*  BUN 46* 51* 64* 74* 84* 87*  CREATININE 1.57* 1.53* 1.61* 1.40* 1.46* 1.47*  CALCIUM 8.6* 8.7*  8.6* 8.2* 7.8* 7.8*  MG 2.0 2.0 2.3 2.5*  --  2.4  PHOS 4.0 3.3 3.9 4.1  --  5.4*   Estimated Creatinine Clearance: 76.3 mL/min (A) (by C-G formula based on SCr of 1.47 mg/dL (H)).   LIVER Recent Labs  Lab 10/25/18 0325 10/26/18 0414 10/27/18 0351 10/28/18 0406 10/28/18 1218  AST 18 17 20 30  35  ALT 11 12 16 24 29   ALKPHOS 87 79 74 58 63  BILITOT 0.5 0.6 0.7 0.8 0.6  PROT 7.5 7.7 7.5 7.0 7.0  ALBUMIN 2.4* 2.6* 2.6* 2.4* 2.5*     INFECTIOUS Recent Labs  Lab 10/25/18 0325 10/26/18 0414  PROCALCITON <0.10  <0.10 <0.10     ENDOCRINE CBG (last 3)  Recent Labs    10/31/18 0022 10/31/18 0412 10/31/18 0753  GLUCAP 128* 126* 113*         IMAGING x48h  - image(s) personally visualized  -   highlighted in bold Dg Chest Port 1 View  Result Date: 10/31/2018 CLINICAL DATA:  Acute respiratory failure. EXAM: PORTABLE CHEST 1 VIEW COMPARISON:  10/29/2018. FINDINGS: Interim extubation and removal of NG tube. Left PICC line stable position. Stable cardiomegaly. Interim improved aeration with partial clearing of pulmonary interstitial edema. Low lung volumes. Small bilateral pleural effusions cannot be excluded. No pneumothorax. IMPRESSION: 1. Interim extubation removal of NG tube. Left PICC line stable position. 2. Stable cardiomegaly. Interim improved aeration with partial clearing of pulmonary interstitial edema. Low lung volumes. Small pleural effusions cannot be excluded. Electronically Signed   By: Marcello Moores  Register   On: 10/31/2018 07:54     Resolved Hospital Problem list    Assessment & Plan:   Acute on Chronic Hypoxemic Respiratory Failure  -baseline 3L home O2 Possible AECOPD OSA w/non compliance of CPAP -pulmonary hypertension and probable component of OHS MSSA in Tracheal Aspirate   10/31/2018 - remains extubated. Did some biPAP [facility]. REfused curative trach. Hoarse voice + - new improving  P:  Daytime o2 Night BiPAP-asked her to be compliant BD Check ABG  now Monitor hoarse vicoie   Sepsis - MSSA trachebronchitis 69/2020 - UTI and BC at OHS showing GPC/ enterococcus  6/15 - afebrile and off abx  P:  ABX as above  ID following  Follow cultures   Hx of anxiety - on topamax, buspar +/- zoloft (zoloeft per daughter but no records of fill) Acute Encephalopathy  -suspect related to hypercarbia/ ammonia   6/15 -  Normal mental status  P:  Dc opioids and benzo Restart topamax and buspar   Chronic Diastolic HF   6/15 - euvolemic as she can be  P:  Restart home lasix  At Risk AKI  6/15 - improving  P:  Trend BMP / urinary output Replace electrolytes as indicated Avoid nephrotoxic agents, ensure adequate renal perfusion  Elevated ammonia w/ normal LFTs -s/p lactulose at OHS P:  Follow LFT's, ammonia intermittently   Super Morbid Obesity  -BMI 110 P:  Will need to consider bariatric consult as outpatient   Best practice:  Diet: NPO Pain/Anxiety/Delirium protocol (if indicated): In place VAP protocol (if indicated): yes DVT prophylaxis: heparin SQ GI prophylaxis: PPI Glucose control: CBG q 4, add SSI if > 180 Mobility: BR Code Status: Full  Family Communication: 6/1 4- Will update when family returns call. 6/15 - patient updated (per social work note 6/14 - patient does not want daughter as dpoa but daughter says this is due to patient's paranoia)  Disposition: move to progressive. TRH primary 11/01/18 and ccm off    ATTESTATION & SIGNATURE   The patient Laura PilotRosa Stewart is critically ill with multiple organ systems failure and requires high complexity decision making for assessment and support, frequent evaluation and titration of therapies, application of advanced monitoring technologies and extensive interpretation of multiple databases.   Critical Care Time devoted to patient care services described in this note is  30  Minutes. This time reflects time of care of this signee Dr Kalman ShanMurali Deosha Werden. This critical  care time does not reflect procedure time, or teaching time or supervisory time of PA/NP/Med student/Med Resident etc but could involve care discussion time     Dr. Kalman ShanMurali Mallie Giambra, M.D., [facility].C.P Pulmonary and Critical Care Medicine Staff Physician [facility] Pager: [phone], If no answer or between  15:00h - 7:00h: call [phone]  10/31/2018 11:00 AM

## 2018-10-31 NOTE — Progress Notes (Signed)
eLink Physician-Brief Progress Note Patient Name: Laura Stewart DOB: Dec 03, 1960 MRN: [id number]   Date of Service  10/31/2018  HPI/Events of Note  Episodes of anxiety, previously on xanax but now extubated and unable to take PO. Currently on BiPap  eICU Interventions  Shift to ativan 0.5 mg IV. Use cautiously as patient likely had OSA.     Intervention Category Minor Interventions: Agitation / anxiety - evaluation and management  Judd Lien 10/31/2018, 12:11 AM

## 2018-10-31 NOTE — Evaluation (Signed)
Clinical/Bedside Swallow Evaluation Patient Details  Name: Laura Stewart MRN: [id number] Date of Birth: 11-28-1960  Today's Date: 10/31/2018 Time: SLP Start Time (ACUTE ONLY): 1016 SLP Stop Time (ACUTE ONLY): 4431 SLP Time Calculation (min) (ACUTE ONLY): 17 min  HPI:  58 y/o F transferred from [facility] 6/9 with morbid obesity and acute on chronic hypoxic respiratory failure and UTI. COVID negative. Intubated 6/8 to 6/14. Per MD possible AECOPD, OSA w/non compliance of CPAP, pulmonary hypertension and probable component of OHS. Demonstrated delayed coughing and possible expectoration of water after sips of water given by RN yesterday.    Assessment / Plan / Recommendation Clinical Impression  Pt alert, able to reposition herself, is moderately dysphonic but talkative, reports her voice has improved a great deal from yesterday. When taking sips of thin liquids, there are no immediate signs of aspiration with small sips or large consecutrive intake. Observed her consume 3 full cups of liquids. She did have frequent belching and one instance of expectoration of thin mucous after also taking applesauce. Presentation appears most consistent with esophageal backflow or reflux. Recommend pt initiate a full liquid diet today and possibly advance if this is tolerated. Meds may be given as tolerated. Will f/u tomorrow to determine if diet advancement is warranted. Discussed with RN.  SLP Visit Diagnosis: Dysphagia, unspecified (R13.10)    Aspiration Risk  Moderate aspiration risk    Diet Recommendation Thin liquid   Liquid Administration via: Cup;Straw Medication Administration: Whole meds with liquid Supervision: Patient able to self feed Compensations: Slow rate;Small sips/bites Postural Changes: Seated upright at 90 degrees;Remain upright for at least 30 minutes after po intake    Other  Recommendations Oral Care Recommendations: Oral care BID   Follow up Recommendations 24 hour  supervision/assistance      Frequency and Duration min 2x/week  2 weeks       Prognosis Prognosis for Safe Diet Advancement: Good      Swallow Study   General HPI: 58 y/o F transferred from [facility] 6/9 with morbid obesity and acute on chronic hypoxic respiratory failure and UTI. COVID negative. Intubated 6/8 to 6/14. Per MD possible AECOPD, OSA w/non compliance of CPAP, pulmonary hypertension and probable component of OHS. Demonstrated delayed coughing and possible expectoration of water after sips of water given by RN yesterday.  Type of Study: Bedside Swallow Evaluation Previous Swallow Assessment: none Diet Prior to this Study: NPO Temperature Spikes Noted: No Respiratory Status: Nasal cannula History of Recent Intubation: Yes Length of Intubations (days): 12 days Date extubated: 10/30/18 Behavior/Cognition: Alert;Cooperative Oral Cavity Assessment: Within Functional Limits Oral Care Completed by SLP: No Oral Cavity - Dentition: Missing dentition Vision: Functional for self-feeding Self-Feeding Abilities: Able to feed self Patient Positioning: Upright in bed Baseline Vocal Quality: Hoarse Volitional Cough: Strong Volitional Swallow: Able to elicit    Oral/Motor/Sensory Function Overall Oral Motor/Sensory Function: Within functional limits   Ice Chips Ice chips: Within functional limits   Thin Liquid Thin Liquid: Impaired Presentation: Straw Other Comments: belching    Nectar Thick Nectar Thick Liquid: Not tested   Honey Thick Honey Thick Liquid: Not tested   Puree Puree: Within functional limits   Solid     Solid: Impaired Presentation: Spoon Oral Phase Functional Implications: Impaired mastication     Herbie Baltimore, MA CCC-SLP  Acute Rehabilitation Services Pager [phone] Office [phone]  Lynann Beaver 10/31/2018,10:40 AM

## 2018-10-31 NOTE — Progress Notes (Signed)
Came into room and patient had taken off bipap mask and refused to put it back on. Placed  back on patient 5L/min. Patient has continuously voiced concerns about someone killing her. Reassured patient that we are here to help her on numerous occasions. Patient stated she "felt funny". Assessed patient and attempted to give patient ativan for anxiety. Patient agreed, however on return with medication patient declined all meds due to "not knowing what we are giving her for sure". After some education and intervening by another RN, patient accepted ativan, but not other meds.   Patient has voiced that she is seeing people in her room and that her daughter was also just here. There have been no visitors on this unit per hospital policy at this time.   She is also insistent that her daughter, Lanelle Bal, is not her POA. We have no paperwork for any POA at this time that I can find.   Spoke with daughter, Elmyra Ricks, earlier in the night who states that normally the patient lives with them. She states that this is not a new issue for the patient and she is paranoid at baseline.  Will continue to monitor patient and intervene as necessary.

## 2018-10-31 NOTE — Progress Notes (Signed)
RT set up BIPAP in patient's room. Patient stated she is not ready to go on BIPAP at this time. BIPAP is plugged into red outlet. BIPAP is not hooked up to Oxygen outlet at this time. Patient's nasal cannula is hooked up to only oxygen outlet in room. RT informed patient to have RT called when she is ready to go on BIPAP, and RT will hook oxygen up to BIPAP and place BIPAP mask on patient. RT will monitor as needed.

## 2018-10-31 NOTE — Progress Notes (Signed)
Pt does not want to take Buspar because she says it causes her to have hallucinations.

## 2018-10-31 NOTE — Progress Notes (Signed)
PROGRESS NOTE    Laura PilotRosa Stewart  ZOX:[id number] DOB: 12/27/1960 DOA: 10/25/2018 PCP: No primary care provider on file.   Brief Narrative:  58 year old female with history of morbid obesity with BMI 110, chronic respiratory failure on 3L [place], COPD, Pulmonary hypertension, OSA, non-compliant with CPAP, and Diastolic HF transferred from [facility] for acute on chronic respiratory failure.   She was found by EMS unresponsive at home with hypoxia in the 50's.  On arrival to [facility], she remained hypoxic on NRB with ABG showing pH 7.12, pCO2 125.7, PO2 44.3.  She was given lasix and required intubation.  She had tranisent hypotension on fentanyl and versed drips requiring levophed briefly.  CXR showed no infiltrate.  Workup noted for normal EKG with neg troponin, BNP 79, lower extremity ultrasound was negative for DVT, BMP showed mild hyperkalemia of 5.9 which was treated with kayexalate, otherwise normal.  UA showed moderate leukocytes with 6-10 WBC, WBC 9, normal procalcitonin, UDS negative,  Ammonia 41, normal LFTs, COVID negative.  Limited echo showed grade 2 diastolic dysfunction with EF 50-55%.  She was started on ceftriaxone for UTI and azithromycin for possible COPD exacerbation.   Blood cultures obtained showed GPC and enterococcus species and therefore started on vancomycin.   She was [facility] to [facility] for bariatric resources since patient's BMI exceeded their bariatric capabilities.  Blood cultures were positive for Enterococcus species.  Infectious  Diseases.  Patient developed gradual improvement of the mental status.  The acute encephalopathy metabolic was related to hypercarbia. Patient is extubated on 10/31/2018, placed on BiPAP.  Patient at baseline uses BiPAP while in the bed.  Per patient uses 4 L of oxygen through nasal cannula at baseline.  Assessment & Plan:   Active Problems:   Acute respiratory failure (HCC)   Enterococcal bacteremia   ##  Acute on chronic hypoxic,  hypercarbic respiratory failure -patient has been noncompliant with CPAP -admitted with pCO2 of 115  - improved with the intubation -patient is treated as community-acquired pneumonia, COPD exacerbation with vancomycin and aztreonam. -extubated on 10/31/2018, currently on BiPAP.    Acute metabolic encephalopathy - 2/2 sepsis, hypercarbic respiratory failure - patient is well conscious oriented x3  Acute on chronic diastolic congestive heart failure -continue with IV Lasix   Obstructive sleep apnea -noncompliant with the CPAP at home - had discussions regarding trach placement, for reach family refused  Sepsis secondary to tracheobronchitis, UTI -blood cultures 1/2 positive for Enterococcus species - appreciate ID consult  - recommended to continue with vancomycin  COPD exacerbation - continue the breathing treatments, antibiotics, Solu-Medrol  Morbid obesity -patient has BMI of 110  -patient will benefit from surgical options of gastric bypass surgery  Severe debility  - continue with physical therapy     DVT prophylaxis: (Lovenox/ Code Status: (Full  Family Communication:  No family members are present at bedside  Disposition Plan: based on physical therapy  Consultants:    critical care   Infectious diseases     Procedures:  Intubation - 10/26/2018-6/15  Antimicrobials:   azithromycin, Rocephin, vancomycin  Subjective:  currently on BiPAP.  Could not answer the questions  Objective: Vitals:   10/31/18 1100 10/31/18 1138 10/31/18 1200 10/31/18 1300  BP: (!) 149/81  (!) 144/86 (!) 142/73  Pulse: 80  79 87  Resp: 19  (!) 25 (!) 29  Temp: 98.7 F (37.1 C)     TempSrc: Axillary     SpO2: 100% 97% 100% 100%  Weight:  Height:        Intake/Output Summary (Last 24 hours) at 10/31/2018 1344 Last data filed at 10/31/2018 0600 Gross per 24 hour  Intake 80 ml  Output 5185 ml  Net -5105 ml   Filed Weights   10/29/18 0431 10/30/18 0251 10/31/18  0500  Weight: (!) 233.6 kg (!) 234.5 kg (!) 228.2 kg    Examination:  General exam: Appears calm and comfortable .  On BiPAP.  Morbidly obese female Respiratory system: Clear to auscultation. Respiratory effort normal. Cardiovascular system: S1 & S2 heard, RRR. No JVD, murmurs, rubs, gallops or clicks. No pedal edema. Gastrointestinal system: Abdomen is nondistended, soft and nontender. No organomegaly or masses felt. Normal bowel sounds heard. Central nervous system: Alert and oriented. No focal neurological deficits. Extremities: Symmetric 5 x 5 power. Skin: No rashes, lesions or ulcers Psychiatry:  Could not examine.  However this seems to be intact     Data Reviewed: I have personally reviewed following labs and imaging studies  CBC: Recent Labs  Lab 10/25/18 0325  10/27/18 0351 10/28/18 0406 10/29/18 0422 10/30/18 0524 10/31/18 0619 10/31/18 1131  WBC 10.0   < > 7.7 5.8 5.8 7.4 8.6  --   NEUTROABS 9.0*  --   --   --  4.8  --   --   --   HGB 9.4*   < > 10.0* 9.7* 9.5* 9.4* 10.5* 13.6  HCT 33.7*   < > 34.7* 33.7* 33.4* 33.2* 36.9 40.0  MCV 97.4   < > 93.5 93.6 94.6 95.1 96.9  --   PLT 170   < > 170 168 137* 139* 152  --    < > = values in this interval not displayed.   Basic Metabolic Panel: Recent Labs  Lab 10/26/18 0414 10/27/18 0351 10/28/18 0406 10/29/18 0422 10/30/18 0524 10/31/18 0619 10/31/18 1131  NA 141 144 145 144 146* 149* 142  K 4.6 4.4 5.0 3.8 4.2 4.6 4.5  CL 98 102 102 101 101 100  --   CO2 32 32 32 33* 33* 39*  --   GLUCOSE 132* 137* 135* 161* 156* 132*  --   BUN 46* 51* 64* 74* 84* 87*  --   CREATININE 1.57* 1.53* 1.61* 1.40* 1.46* 1.47*  --   CALCIUM 8.6* 8.7* 8.6* 8.2* 7.8* 7.8*  --   MG 2.0 2.0 2.3 2.5*  --  2.4  --   PHOS 4.0 3.3 3.9 4.1  --  5.4*  --    GFR: Estimated Creatinine Clearance: 76.3 mL/min (A) (by C-G formula based on SCr of 1.47 mg/dL (H)). Liver Function Tests: Recent Labs  Lab 10/25/18 0325 10/26/18 0414 10/27/18  0351 10/28/18 0406 10/28/18 1218  AST 18 17 20 30  35  ALT 11 12 16 24 29   ALKPHOS 87 79 74 58 63  BILITOT 0.5 0.6 0.7 0.8 0.6  PROT 7.5 7.7 7.5 7.0 7.0  ALBUMIN 2.4* 2.6* 2.6* 2.4* 2.5*   No results for input(s): LIPASE, AMYLASE in the last 168 hours. Recent Labs  Lab 10/25/18 0325 10/25/18 1252  AMMONIA 36* 26   Coagulation Profile: No results for input(s): INR, PROTIME in the last 168 hours. Cardiac Enzymes: No results for input(s): CKTOTAL, CKMB, CKMBINDEX, TROPONINI in the last 168 hours. BNP (last 3 results) No results for input(s): PROBNP in the last 8760 hours. HbA1C: No results for input(s): HGBA1C in the last 72 hours. CBG: Recent Labs  Lab 10/30/18 2002 10/31/18 0022 10/31/18 [id number]  10/31/18 0753 10/31/18 1124  GLUCAP 122* 128* 126* 113* 164*   Lipid Profile: No results for input(s): CHOL, HDL, LDLCALC, TRIG, CHOLHDL, LDLDIRECT in the last 72 hours. Thyroid Function Tests: No results for input(s): TSH, T4TOTAL, FREET4, T3FREE, THYROIDAB in the last 72 hours. Anemia Panel: No results for input(s): VITAMINB12, FOLATE, FERRITIN, TIBC, IRON, RETICCTPCT in the last 72 hours. Sepsis Labs: Recent Labs  Lab 10/25/18 0325 10/26/18 0414  PROCALCITON <0.10  <0.10 <0.10    Recent Results (from the past 240 hour(s))  MRSA PCR Screening     Status: None   Collection Time: 10/25/18  2:28 AM   Specimen: Nasal Mucosa; Nasopharyngeal  Result Value Ref Range Status   MRSA by PCR NEGATIVE NEGATIVE Final    Comment:        The GeneXpert MRSA Assay (FDA approved for NASAL specimens only), is one component of a comprehensive MRSA colonization surveillance program. It is not intended to diagnose MRSA infection nor to guide or monitor treatment for MRSA infections. Performed at [facility], [address] [place], [place] [address]   Culture, respiratory (non-expectorated)     Status: None   Collection Time: 10/25/18  2:28 AM   Specimen: Tracheal  Aspirate; Respiratory  Result Value Ref Range Status   Specimen Description TRACHEAL ASPIRATE  Final   Special Requests NONE  Final   Gram Stain   Final    FEW WBC PRESENT,BOTH PMN AND MONONUCLEAR RARE GRAM POSITIVE COCCI IN PAIRS Performed at [facility], [address] [place], [place] [address]    Culture RARE STAPHYLOCOCCUS AUREUS  Final   Report Status 10/28/2018 FINAL  Final   Organism ID, Bacteria STAPHYLOCOCCUS AUREUS  Final      Susceptibility   Staphylococcus aureus - MIC*    CIPROFLOXACIN <=0.5 SENSITIVE Sensitive     ERYTHROMYCIN >=8 RESISTANT Resistant     GENTAMICIN <=0.5 SENSITIVE Sensitive     OXACILLIN 0.5 SENSITIVE Sensitive     TETRACYCLINE <=1 SENSITIVE Sensitive     VANCOMYCIN <=0.5 SENSITIVE Sensitive     TRIMETH/SULFA <=10 SENSITIVE Sensitive     CLINDAMYCIN RESISTANT Resistant     RIFAMPIN <=0.5 SENSITIVE Sensitive     Inducible Clindamycin POSITIVE Resistant     * RARE STAPHYLOCOCCUS AUREUS  Culture, blood (routine x 2)     Status: None   Collection Time: 10/25/18  2:40 AM   Specimen: BLOOD LEFT HAND  Result Value Ref Range Status   Specimen Description BLOOD LEFT HAND  Final   Special Requests   Final    BOTTLES DRAWN AEROBIC ONLY Blood Culture adequate volume   Culture   Final    NO GROWTH 5 DAYS Performed at [facility], [address] [place], [place] [address]    Report Status 10/30/2018 FINAL  Final  SARS Coronavirus 2     Status: None   Collection Time: 10/25/18  2:48 AM  Result Value Ref Range Status   SARS Coronavirus 2 NOT DETECTED NOT DETECTED Final    Comment: (NOTE) SARS-CoV-2 target nucleic acids are NOT DETECTED. The SARS-CoV-2 RNA is generally detectable in upper and lower respiratory specimens during the acute phase of infection.  Negative  results do not preclude SARS-CoV-2 infection, do not rule out co-infections with other pathogens, and should not be used as the sole basis for treatment or other patient  management decisions.  Negative results must be combined with clinical observations, patient history, and epidemiological  information. The expected result is Not Detected. Fact Sheet for Patients: http://www.biofiredefense.com/wp-content/uploads/2020/03/BIOFIRE-COVID -19-patients.pdf Fact Sheet for Healthcare Providers: http://www.biofiredefense.com/wp-content/uploads/2020/03/BIOFIRE-COVID -19-hcp.pdf This test is not yet approved or cleared by the [place] and  has been authorized for detection and/or diagnosis of SARS-CoV-2 by FDA under an Emergency Use Authorization (EUA).  This EUA will remain in effec t (meaning this test can be used) for the duration of  the COVID-19 declaration under Section 564(b)(1) of the Act, 21 U.S.C. section 360bbb-3(b)(1), unless the authorization is terminated or revoked sooner. Performed at [facility], [place] [address], [place], [place] [address]   Culture, blood (routine x 2)     Status: None   Collection Time: 10/25/18  2:50 AM   Specimen: BLOOD LEFT HAND  Result Value Ref Range Status   Specimen Description BLOOD LEFT HAND  Final   Special Requests   Final    BOTTLES DRAWN AEROBIC ONLY Blood Culture adequate volume   Culture   Final    NO GROWTH 5 DAYS Performed at [facility], [place] [address], [place], [place] [address]    Report Status 10/30/2018 FINAL  Final  Urine culture     Status: None   Collection Time: 10/25/18  5:03 AM   Specimen: Urine, Random  Result Value Ref Range Status   Specimen Description URINE, RANDOM  Final   Special Requests NONE  Final   Culture   Final    NO GROWTH Performed at [facility], [place] [address], [place], [place] [address]    Report Status 10/26/2018 FINAL  Final         Radiology Studies: Dg Chest Port 1 View  Result Date: 10/31/2018 CLINICAL DATA:  Acute respiratory failure. EXAM: PORTABLE CHEST 1 VIEW COMPARISON:  10/29/2018. FINDINGS: Interim extubation and  removal of NG tube. Left PICC line stable position. Stable cardiomegaly. Interim improved aeration with partial clearing of pulmonary interstitial edema. Low lung volumes. Small bilateral pleural effusions cannot be excluded. No pneumothorax. IMPRESSION: 1. Interim extubation removal of NG tube. Left PICC line stable position. 2. Stable cardiomegaly. Interim improved aeration with partial clearing of pulmonary interstitial edema. Low lung volumes. Small pleural effusions cannot be excluded. Electronically Signed   By: Marcello Moores  Register   On: 10/31/2018 07:54        Scheduled Meds: . arformoterol  15 mcg Nebulization BID  . budesonide (PULMICORT) nebulizer solution  0.5 mg Nebulization BID  . busPIRone  30 mg Oral BID  . chlorhexidine  15 mL Mouth Rinse BID  . Chlorhexidine Gluconate Cloth  6 each Topical Daily  . docusate  100 mg Oral BID  . furosemide  40 mg Oral Daily  . heparin  5,000 Units Subcutaneous Q8H  . insulin aspart  0-9 Units Subcutaneous Q4H  . ipratropium-albuterol  3 mL Nebulization Q6H  . mouth rinse  15 mL Mouth Rinse q12n4p  . methylPREDNISolone (SOLU-MEDROL) injection  40 mg Intravenous Q8H  . pantoprazole (PROTONIX) IV  40 mg Intravenous Q12H  . sennosides  5 mL Oral BID  . [START ON 11/01/2018] sertraline  25 mg Oral Daily  . sodium chloride flush  10-40 mL Intracatheter Q12H  . topiramate  50 mg Oral QHS   Continuous Infusions:   LOS: 6 days    Time spent: 40 min   Audree Schrecengost, MD [facility] Pager [phone]  If 7PM-7AM, please contact night-coverage [email or web address] Password [facility] 10/31/2018, 1:44 PM

## 2018-11-01 DIAGNOSIS — R5381 Other malaise: Secondary | ICD-10-CM

## 2018-11-01 DIAGNOSIS — G9341 Metabolic encephalopathy: Secondary | ICD-10-CM

## 2018-11-01 DIAGNOSIS — J9601 Acute respiratory failure with hypoxia: Secondary | ICD-10-CM

## 2018-11-01 DIAGNOSIS — J441 Chronic obstructive pulmonary disease with (acute) exacerbation: Secondary | ICD-10-CM

## 2018-11-01 LAB — CBC WITH DIFFERENTIAL/PLATELET
Abs Immature Granulocytes: 0.15 10*3/uL — ABNORMAL HIGH (ref 0.00–0.07)
Basophils Absolute: 0 10*3/uL (ref 0.0–0.1)
Basophils Relative: 0 %
Eosinophils Absolute: 0 10*3/uL (ref 0.0–0.5)
Eosinophils Relative: 0 %
HCT: 36.6 % (ref 36.0–46.0)
Hemoglobin: 10.4 g/dL — ABNORMAL LOW (ref 12.0–15.0)
Immature Granulocytes: 2 %
Lymphocytes Relative: 7 %
Lymphs Abs: 0.5 10*3/uL — ABNORMAL LOW (ref 0.7–4.0)
MCH: 27.2 pg (ref 26.0–34.0)
MCHC: 28.4 g/dL — ABNORMAL LOW (ref 30.0–36.0)
MCV: 95.6 fL (ref 80.0–100.0)
Monocytes Absolute: 0.4 10*3/uL (ref 0.1–1.0)
Monocytes Relative: 6 %
Neutro Abs: 5.8 10*3/uL (ref 1.7–7.7)
Neutrophils Relative %: 85 %
Platelets: 143 10*3/uL — ABNORMAL LOW (ref 150–400)
RBC: 3.83 MIL/uL — ABNORMAL LOW (ref 3.87–5.11)
RDW: 14.8 % (ref 11.5–15.5)
WBC: 6.9 10*3/uL (ref 4.0–10.5)
nRBC: 0 % (ref 0.0–0.2)

## 2018-11-01 LAB — BASIC METABOLIC PANEL
Anion gap: 10 (ref 5–15)
BUN: 70 mg/dL — ABNORMAL HIGH (ref 6–20)
CO2: 35 mmol/L — ABNORMAL HIGH (ref 22–32)
Calcium: 8 mg/dL — ABNORMAL LOW (ref 8.9–10.3)
Chloride: 98 mmol/L (ref 98–111)
Creatinine, Ser: 1.16 mg/dL — ABNORMAL HIGH (ref 0.44–1.00)
GFR calc Af Amer: >60 mL/min (ref >60)
GFR calc non Af Amer: 52 mL/min — ABNORMAL LOW (ref >60)
Glucose, Bld: 119 mg/dL — ABNORMAL HIGH (ref 70–99)
Potassium: 4.7 mmol/L (ref 3.5–5.1)
Sodium: 143 mmol/L (ref 135–145)

## 2018-11-01 LAB — GLUCOSE, CAPILLARY
Glucose-Capillary: 114 mg/dL — ABNORMAL HIGH (ref 70–99)
Glucose-Capillary: 121 mg/dL — ABNORMAL HIGH (ref 70–99)
Glucose-Capillary: 133 mg/dL — ABNORMAL HIGH (ref 70–99)
Glucose-Capillary: 153 mg/dL — ABNORMAL HIGH (ref 70–99)
Glucose-Capillary: 155 mg/dL — ABNORMAL HIGH (ref 70–99)
Glucose-Capillary: 97 mg/dL (ref 70–99)

## 2018-11-01 LAB — PHOSPHORUS: Phosphorus: 3.8 mg/dL (ref 2.5–4.6)

## 2018-11-01 LAB — MAGNESIUM: Magnesium: 2.3 mg/dL (ref 1.7–2.4)

## 2018-11-01 LAB — TROPONIN I: Troponin I: 0.04 ng/mL (ref <0.03)

## 2018-11-01 LAB — LACTIC ACID, PLASMA: Lactic Acid, Venous: 0.8 mmol/L (ref 0.5–1.9)

## 2018-11-01 NOTE — Progress Notes (Signed)
RT placed patient on BIPAP. Patient tolerating well at this time. RT will monitor as needed. 

## 2018-11-01 NOTE — Progress Notes (Signed)
  Speech Language Pathology Treatment: Dysphagia  Patient Details Name: Laura Stewart MRN: [id number] DOB: Apr 01, 1961 Today's Date: 11/01/2018 Time: 1120-1130 SLP Time Calculation (min) (ACUTE ONLY): 10 min  Assessment / Plan / Recommendation Clinical Impression  Pt demonstrates excellent tolerance of thin liquids, decreased belching today, no expectoration of mucous, even when also taking solids. Pt is ready to advance diet to mechanical soft given missing dentition. No further skilled intervention needed will sign off  HPI        SLP Plan  All goals met       Recommendations  Diet recommendations: Dysphagia 3 (mechanical soft);Thin liquid Liquids provided via: Cup;Straw Medication Administration: Whole meds with liquid Supervision: Patient able to self feed Compensations: Slow rate;Small sips/bites                Oral Care Recommendations: Oral care BID Follow up Recommendations: 24 hour supervision/assistance SLP Visit Diagnosis: Dysphagia, unspecified (R13.10) Plan: All goals met       GO               Herbie [place], MA [place] Pager [phone] Office [phone]  Lynann Beaver 11/01/2018, 11:33 AM

## 2018-11-01 NOTE — Progress Notes (Signed)
Marland Kitchen.  PROGRESS NOTE    Lodema PilotRosa Stewart  ZOX:[id number] DOB: 01/11/1961 DOA: 10/25/2018 PCP: No primary care provider on file.   Brief Narrative:   58 year old female with history of morbid obesity with BMI 110, chronic respiratory failure on 3L [place], COPD, Pulmonary hypertension, OSA, non-compliant with CPAP, and Diastolic HF transferred [facility] for acute on chronic respiratory failure.   She was found by EMS unresponsive at home with hypoxia in the 50's. On arrival to [facility], she remained hypoxic on NRB with ABG showing pH 7.12, pCO2 125.7, PO2 44.3. She was given lasix and required intubation. She had tranisent hypotension on fentanyl and versed drips requiring levophed briefly. CXR showed no infiltrate. Workup noted for normal EKG with neg troponin, BNP 79, lower extremity ultrasound was negative for DVT, BMP showed mild hyperkalemia of 5.9 which was treated with kayexalate, otherwise normal. UA showed moderate leukocytes with 6-10 WBC, WBC 9, normal procalcitonin, UDS negative, Ammonia 41, normal LFTs, COVID negative. Limited echo showed grade 2 diastolic dysfunction with EF 50-55%. She was started on ceftriaxone for UTI and azithromycin for possible COPD exacerbation. Blood cultures obtained showed GPC and enterococcus species and therefore started on vancomycin. She was transferred to [facility] for bariatric resources since patient's BMI exceeded their bariatric capabilities. Blood cultures were positive for Enterococcus species.  Infectious  Diseases.  Patient developed gradual improvement of the mental status.  The acute encephalopathy metabolic was related to hypercarbia. Patient is extubated on 10/31/2018, placed on BiPAP.  Patient at baseline uses BiPAP while in the bed.  Per patient uses 4 L of oxygen through nasal cannula at baseline.   Assessment & Plan:   Active Problems:   Acute respiratory failure (HCC)   Enterococcal bacteremia   Acute on chronic  hypoxic, hypercarbic respiratory failure     - patient has been noncompliant with CPAP     - admitted with pCO2 of 115      - improved with the intubation     - patient is treated as community-acquired pneumonia, COPD exacerbation with vancomycin and aztreonam.     - extubated on 10/31/2018, currently on BiPAP.      - completed vanc regimen   Acute metabolic encephalopathy     - 2/2 sepsis, hypercarbic respiratory failure     - patient is well conscious oriented x3  Acute on chronic diastolic congestive heart failure     - continue with IV Lasix   Obstructive sleep apnea     - noncompliant with the CPAP at home     - had discussions regarding trach placement, family refused  Sepsis secondary to tracheobronchitis, UTI     - blood cultures 1/2 positive for Enterococcus species     - appreciate ID consult      - completed vancomycin regimen  COPD exacerbation     - continue the breathing treatments, antibiotics, Solu-Medrol  Morbid obesity     - patient has BMI of 110      - patient will benefit from surgical options of gastric bypass surgery  Severe debility      - PT eval pending   DVT prophylaxis: heparin Code Status: FULL   Disposition Plan: TBD   Consultants:   ID  PCCM  Subjective: "I'm ready to work with therapy"  Objective: Vitals:   11/01/18 0200 11/01/18 0410 11/01/18 0422 11/01/18 0801  BP: 122/66  (!) 108/55   Pulse: 77  78   Resp: 20  19   Temp:  97.6 F (36.4 C)    TempSrc:      SpO2: 98%  97% 97%  Weight:      Height:        Intake/Output Summary (Last 24 hours) at 11/01/2018 1505 Last data filed at 11/01/2018 0950 Gross per 24 hour  Intake 0 ml  Output 2300 ml  Net -2300 ml   Filed Weights   10/29/18 0431 10/30/18 0251 10/31/18 0500  Weight: (!) 233.6 kg (!) 234.5 kg (!) 228.2 kg    Examination:  General exam: 58 y.o. female Appears calm and comfortable  Respiratory system: Clear to auscultation. Respiratory effort  normal. Cardiovascular system: S1 & S2 heard, RRR. No JVD, murmurs, rubs, gallops or clicks. Gastrointestinal system: Abdomen is nondistended, soft and nontender. No organomegaly or masses felt. Normal bowel sounds heard. Obese Central nervous system: Alert and oriented. No focal neurological deficits. Skin: No rashes, lesions or ulcers   Data Reviewed: I have personally reviewed following labs and imaging studies.  CBC: Recent Labs  Lab 10/28/18 0406 10/29/18 0422 10/30/18 0524 10/31/18 0619 10/31/18 1131 11/01/18 0445  WBC 5.8 5.8 7.4 8.6  --  6.9  NEUTROABS  --  4.8  --   --   --  5.8  HGB 9.7* 9.5* 9.4* 10.5* 13.6 10.4*  HCT 33.7* 33.4* 33.2* 36.9 40.0 36.6  MCV 93.6 94.6 95.1 96.9  --  95.6  PLT 168 137* 139* 152  --  143*   Basic Metabolic Panel: Recent Labs  Lab 10/27/18 0351 10/28/18 0406 10/29/18 0422 10/30/18 0524 10/31/18 0619 10/31/18 1131 10/31/18 2344 11/01/18 0445  NA 144 145 144 146* 149* 142 143  --   K 4.4 5.0 3.8 4.2 4.6 4.5 4.7  --   CL 102 102 101 101 100  --  98  --   CO2 32 32 33* 33* 39*  --  35*  --   GLUCOSE 137* 135* 161* 156* 132*  --  119*  --   BUN 51* 64* 74* 84* 87*  --  70*  --   CREATININE 1.53* 1.61* 1.40* 1.46* 1.47*  --  1.16*  --   CALCIUM 8.7* 8.6* 8.2* 7.8* 7.8*  --  8.0*  --   MG 2.0 2.3 2.5*  --  2.4  --   --  2.3  PHOS 3.3 3.9 4.1  --  5.4*  --   --  3.8   GFR: Estimated Creatinine Clearance: 96.6 mL/min (A) (by C-G formula based on SCr of 1.16 mg/dL (H)). Liver Function Tests: Recent Labs  Lab 10/26/18 0414 10/27/18 0351 10/28/18 0406 10/28/18 1218  AST 35  ALT ALKPHOS 79 74 58 63  BILITOT 0.6 0.7 0.8 0.6  PROT 7.7 7.5 7.0 7.0  ALBUMIN 2.6* 2.6* 2.4* 2.5*   No results for input(s): LIPASE, AMYLASE in the last 168 hours. No results for input(s): AMMONIA in the last 168 hours. Coagulation Profile: No results for input(s): INR, PROTIME in the last 168 hours. Cardiac Enzymes: Recent Labs   Lab 11/01/18 0445  TROPONINI 0.04*   BNP (last 3 results) No results for input(s): PROBNP in the last 8760 hours. HbA1C: No results for input(s): HGBA1C in the last 72 hours. CBG: Recent Labs  Lab 10/31/18 2024 11/01/18 0045 11/01/18 0408 11/01/18 0807 11/01/18 1153  GLUCAP 107* 114* 133* 153* 155*   Lipid Profile: No results for input(s): CHOL, HDL, LDLCALC, TRIG,  CHOLHDL, LDLDIRECT in the last 72 hours. Thyroid Function Tests: No results for input(s): TSH, T4TOTAL, FREET4, T3FREE, THYROIDAB in the last 72 hours. Anemia Panel: No results for input(s): VITAMINB12, FOLATE, FERRITIN, TIBC, IRON, RETICCTPCT in the last 72 hours. Sepsis Labs: Recent Labs  Lab 10/26/18 0414 10/31/18 2344  PROCALCITON <0.10  --   LATICACIDVEN  --  0.8    Recent Results (from the past 240 hour(s))  MRSA PCR Screening     Status: None   Collection Time: 10/25/18  2:28 AM   Specimen: Nasal Mucosa; Nasopharyngeal  Result Value Ref Range Status   MRSA by PCR NEGATIVE NEGATIVE Final    Comment:        The GeneXpert MRSA Assay (FDA approved for NASAL specimens only), is one component of a comprehensive MRSA colonization surveillance program. It is not intended to diagnose MRSA infection nor to guide or monitor treatment for MRSA infections. Performed at [facility], [address] [place], [place] [address]   Culture, respiratory (non-expectorated)     Status: None   Collection Time: 10/25/18  2:28 AM   Specimen: Tracheal Aspirate; Respiratory  Result Value Ref Range Status   Specimen Description TRACHEAL ASPIRATE  Final   Special Requests NONE  Final   Gram Stain   Final    FEW WBC PRESENT,BOTH PMN AND MONONUCLEAR RARE GRAM POSITIVE COCCI IN PAIRS Performed at [facility], [address] [place], [place] [address]    Culture RARE STAPHYLOCOCCUS AUREUS  Final   Report Status 10/28/2018 FINAL  Final   Organism ID, Bacteria STAPHYLOCOCCUS AUREUS  Final       Susceptibility   Staphylococcus aureus - MIC*    CIPROFLOXACIN <=0.5 SENSITIVE Sensitive     ERYTHROMYCIN >=8 RESISTANT Resistant     GENTAMICIN <=0.5 SENSITIVE Sensitive     OXACILLIN 0.5 SENSITIVE Sensitive     TETRACYCLINE <=1 SENSITIVE Sensitive     VANCOMYCIN <=0.5 SENSITIVE Sensitive     TRIMETH/SULFA <=10 SENSITIVE Sensitive     CLINDAMYCIN RESISTANT Resistant     RIFAMPIN <=0.5 SENSITIVE Sensitive     Inducible Clindamycin POSITIVE Resistant     * RARE STAPHYLOCOCCUS AUREUS  Culture, blood (routine x 2)     Status: None   Collection Time: 10/25/18  2:40 AM   Specimen: BLOOD LEFT HAND  Result Value Ref Range Status   Specimen Description BLOOD LEFT HAND  Final   Special Requests   Final    BOTTLES DRAWN AEROBIC ONLY Blood Culture adequate volume   Culture   Final    NO GROWTH 5 DAYS Performed at [facility], [address] [place], [place] [address]    Report Status 10/30/2018 FINAL  Final  SARS Coronavirus 2     Status: None   Collection Time: 10/25/18  2:48 AM  Result Value Ref Range Status   SARS Coronavirus 2 NOT DETECTED NOT DETECTED Final    Comment: (NOTE) SARS-CoV-2 target nucleic acids are NOT DETECTED. The SARS-CoV-2 RNA is generally detectable in upper and lower respiratory specimens during the acute phase of infection.  Negative  results do not preclude SARS-CoV-2 infection, do not rule out co-infections with other pathogens, and should not be used as the sole basis for treatment or other patient management decisions.  Negative results must be combined with clinical observations, patient history, and epidemiological information. The expected result is Not Detected. Fact Sheet for Patients: http://www.biofiredefense.com/wp-content/uploads/2020/03/BIOFIRE-COVID -19-patients.pdf Fact Sheet for Healthcare Providers:  http://www.biofiredefense.com/wp-content/uploads/2020/03/BIOFIRE-COVID -19-hcp.pdf This test is not yet approved or cleared by the  [place] FDA and  has been authorized for detection and/or diagnosis of SARS-CoV-2 by FDA under an Emergency Use Authorization (EUA).  This EUA will remain in effec t (meaning this test can be used) for the duration of  the COVID-19 declaration under Section 564(b)(1) of the Act, 21 U.S.C. section 360bbb-3(b)(1), unless the authorization is terminated or revoked sooner. Performed at [facility], [address] [place], [place] [address]   Culture, blood (routine x 2)     Status: None   Collection Time: 10/25/18  2:50 AM   Specimen: BLOOD LEFT HAND  Result Value Ref Range Status   Specimen Description BLOOD LEFT HAND  Final   Special Requests   Final    BOTTLES DRAWN AEROBIC ONLY Blood Culture adequate volume   Culture   Final    NO GROWTH 5 DAYS Performed at [facility], [address] [place], [place] [address]    Report Status 10/30/2018 FINAL  Final  Urine culture     Status: None   Collection Time: 10/25/18  5:03 AM   Specimen: Urine, Random  Result Value Ref Range Status   Specimen Description URINE, RANDOM  Final   Special Requests NONE  Final   Culture   Final    NO GROWTH Performed at [facility], [address] [place], [place] [address]    Report Status 10/26/2018 FINAL  Final         Radiology Studies: Dg Chest Port 1 View  Result Date: 10/31/2018 CLINICAL DATA:  Acute respiratory failure. EXAM: PORTABLE CHEST 1 VIEW COMPARISON:  10/29/2018. FINDINGS: Interim extubation and removal of NG tube. Left PICC line stable position. Stable cardiomegaly. Interim improved aeration with partial clearing of pulmonary interstitial edema. Low lung volumes. Small bilateral pleural effusions cannot be excluded. No pneumothorax. IMPRESSION: 1. Interim extubation removal of NG tube. Left PICC line stable position. 2. Stable cardiomegaly. Interim improved aeration with partial clearing of pulmonary interstitial edema. Low lung volumes. Small pleural  effusions cannot be excluded. Electronically Signed   By: Maisie Fushomas  Register   On: 10/31/2018 07:54        Scheduled Meds: . arformoterol  15 mcg Nebulization BID  . budesonide (PULMICORT) nebulizer solution  0.5 mg Nebulization BID  . busPIRone  30 mg Oral BID  . chlorhexidine  15 mL Mouth Rinse BID  . Chlorhexidine Gluconate Cloth  6 each Topical Daily  . docusate  100 mg Oral BID  . furosemide  40 mg Oral Daily  . heparin  5,000 Units Subcutaneous Q8H  . insulin aspart  0-9 Units Subcutaneous Q4H  . ipratropium-albuterol  3 mL Nebulization TID  . mouth rinse  15 mL Mouth Rinse q12n4p  . methylPREDNISolone (SOLU-MEDROL) injection  40 mg Intravenous Q8H  . multivitamin with minerals  1 tablet Oral Daily  . pantoprazole (PROTONIX) IV  40 mg Intravenous Q12H  . Ensure Max Protein  11 oz Oral Daily  . sennosides  5 mL Oral BID  . sertraline  25 mg Oral Daily  . sodium chloride flush  10-40 mL Intracatheter Q12H  . topiramate  50 mg Oral QHS   Continuous Infusions:   LOS: 7 days    Time spent: 25 minutes spent in the coordination of care today.    Teddy Spikeyrone A Onnie Alatorre, DO [facility] Pager [phone]  If 7PM-7AM, please contact night-coverage [email or web address] Password [email or web address]  11/01/2018, 3:05 PM

## 2018-11-02 LAB — RENAL FUNCTION PANEL
Albumin: 2.5 g/dL — ABNORMAL LOW (ref 3.5–5.0)
Anion gap: 8 (ref 5–15)
BUN: 53 mg/dL — ABNORMAL HIGH (ref 6–20)
CO2: 34 mmol/L — ABNORMAL HIGH (ref 22–32)
Calcium: 8.6 mg/dL — ABNORMAL LOW (ref 8.9–10.3)
Chloride: 100 mmol/L (ref 98–111)
Creatinine, Ser: 1.19 mg/dL — ABNORMAL HIGH (ref 0.44–1.00)
GFR calc Af Amer: 58 mL/min — ABNORMAL LOW (ref >60)
GFR calc non Af Amer: 50 mL/min — ABNORMAL LOW (ref >60)
Glucose, Bld: 151 mg/dL — ABNORMAL HIGH (ref 70–99)
Phosphorus: 3.7 mg/dL (ref 2.5–4.6)
Potassium: 4.9 mmol/L (ref 3.5–5.1)
Sodium: 142 mmol/L (ref 135–145)

## 2018-11-02 LAB — CBC WITH DIFFERENTIAL/PLATELET
Abs Immature Granulocytes: 0.1 10*3/uL — ABNORMAL HIGH (ref 0.00–0.07)
Basophils Absolute: 0 10*3/uL (ref 0.0–0.1)
Basophils Relative: 0 %
Eosinophils Absolute: 0 10*3/uL (ref 0.0–0.5)
Eosinophils Relative: 0 %
HCT: 38.8 % (ref 36.0–46.0)
Hemoglobin: 11 g/dL — ABNORMAL LOW (ref 12.0–15.0)
Immature Granulocytes: 1 %
Lymphocytes Relative: 7 %
Lymphs Abs: 0.5 10*3/uL — ABNORMAL LOW (ref 0.7–4.0)
MCH: 26.9 pg (ref 26.0–34.0)
MCHC: 28.4 g/dL — ABNORMAL LOW (ref 30.0–36.0)
MCV: 94.9 fL (ref 80.0–100.0)
Monocytes Absolute: 0.6 10*3/uL (ref 0.1–1.0)
Monocytes Relative: 8 %
Neutro Abs: 6.1 10*3/uL (ref 1.7–7.7)
Neutrophils Relative %: 84 %
Platelets: 152 10*3/uL (ref 150–400)
RBC: 4.09 MIL/uL (ref 3.87–5.11)
RDW: 14.6 % (ref 11.5–15.5)
WBC: 7.3 10*3/uL (ref 4.0–10.5)
nRBC: 0 % (ref 0.0–0.2)

## 2018-11-02 LAB — GLUCOSE, CAPILLARY
Glucose-Capillary: 113 mg/dL — ABNORMAL HIGH (ref 70–99)
Glucose-Capillary: 117 mg/dL — ABNORMAL HIGH (ref 70–99)
Glucose-Capillary: 118 mg/dL — ABNORMAL HIGH (ref 70–99)
Glucose-Capillary: 134 mg/dL — ABNORMAL HIGH (ref 70–99)
Glucose-Capillary: 143 mg/dL — ABNORMAL HIGH (ref 70–99)
Glucose-Capillary: 146 mg/dL — ABNORMAL HIGH (ref 70–99)

## 2018-11-02 LAB — MAGNESIUM: Magnesium: 2.5 mg/dL — ABNORMAL HIGH (ref 1.7–2.4)

## 2018-11-02 MED ORDER — SENNA 8.6 MG PO TABS
1.0000 | ORAL_TABLET | Freq: Two times a day (BID) | ORAL | Status: DC
  Administered 2018-11-02 – 2018-11-04 (×4): 8.6 mg via ORAL
  Filled 2018-11-02 (×4): qty 1

## 2018-11-02 MED ORDER — METHYLPREDNISOLONE SODIUM SUCC 125 MG IJ SOLR
40.0000 mg | INTRAMUSCULAR | Status: DC
  Administered 2018-11-03 – 2018-11-04 (×2): 40 mg via INTRAVENOUS
  Filled 2018-11-02 (×2): qty 2

## 2018-11-02 MED ORDER — DOCUSATE SODIUM 100 MG PO CAPS
100.0000 mg | ORAL_CAPSULE | Freq: Two times a day (BID) | ORAL | Status: DC
  Administered 2018-11-02 – 2018-11-04 (×4): 100 mg via ORAL
  Filled 2018-11-02 (×4): qty 1

## 2018-11-02 NOTE — Progress Notes (Addendum)
Pt was not able to sit on side of bed with PT,

## 2018-11-02 NOTE — Progress Notes (Signed)
Patient stated would request to be put on bipap and 0130. Upon following up with the patient, at 0250 patient stated would like to be put on bipap and was told she would need to leave it on for at least 2 hours per day. Spoke with RT at [place] to place patient on bipap. RT stated spoke with patient earlier in night and told to notify RN when ready for bipap as she was not ready at that time.   During reassessment bipap removed at 0445. Put [place] on 3.5L. RT notified at 0523.  Patient stated observed a roach crawling across wall and TV, RN at bedside and no roach observed.   Paged NP Schorr at [facility] notified of  bleeding observed in peri area and patient has foley. Pt.stated has been bleeding for 1 week.Difficult to assess if vag or urinary. Upon inquiry patient stated she no longer gets menses and when spoke with the doctor they stated it would go away.  Spoke with NP via phone advised to place peri pad to monitor bleeding and will inform day team to follow up.   Performed patient care at 0630 (CHG and foley care), medium amount of blood that appeared to be coming from vaginal area. Patient stated has an IUD that was inserted 10 years prior. Informed day RN of discharge and need to follow up with day team.

## 2018-11-02 NOTE — Progress Notes (Signed)
PROGRESS NOTE    Laura PilotRosa Stewart  ZOX:[id number] DOB: 10/21/1960 DOA: 10/25/2018 PCP: No primary care provider on file.    Brief Narrative:  58 year old female with history of morbid obesity with BMI 110, chronic respiratory failure on 3L [place], COPD, Pulmonary hypertension, OSA, non-compliant with CPAP, and Diastolic HF transferred from [facility] for acute on chronic respiratory failure.   She was found by EMS unresponsive at home with hypoxia in the 50's. On arrival to [facility], she remained hypoxic on NRB with ABG showing pH 7.12, pCO2 125.7, PO2 44.3. She was given lasix and required intubation. She had tranisent hypotension on fentanyl and versed drips requiring levophed briefly. CXR showed no infiltrate. Workup noted for normal EKG with neg troponin, BNP 79, lower extremity ultrasound was negative for DVT, BMP showed mild hyperkalemia of 5.9 which was treated with kayexalate, otherwise normal. UA showed moderate leukocytes with 6-10 WBC, WBC 9, normal procalcitonin, UDS negative, Ammonia 41, normal LFTs, COVID negative. Limited echo showed grade 2 diastolic dysfunction with EF 50-55%. She was started on ceftriaxone for UTI and azithromycin for possible COPD exacerbation. Blood cultures obtained showed GPC and enterococcus species and therefore started on vancomycin. She was [facility] for bariatric resources since patient's BMI exceeded their bariatric capabilities.Blood cultures were positive for Enterococcus species. Infectious Diseases. Patient developed gradual improvement of the mental status. The acute encephalopathy metabolic was related to hypercarbia. Patient is extubated on 10/31/2018, placed on BiPAP. Patient at baseline uses BiPAP while in the bed. Per patient uses 4 L of oxygen through nasal cannula at baseline.   Pt seen and examined today. She is currently on 5 lit of [place] oxygen.   Assessment & Plan:   Active Problems:   Acute  respiratory failure (HCC)   Enterococcal bacteremia   Acute metabolic encephalopathy   COPD with acute exacerbation (HCC)   Debility    Acute on chronic hypoxic, hypercarbic respiratory failure     - patient has been noncompliant with CPAP     - admitted with pCO2 of 115      - improved with the intubation     - patient is treated as community-acquired pneumonia, COPD exacerbation with vancomycin and aztreonam.     - extubated on 10/31/2018, currently on 5 lit of [place] oxygen.      - completed vanc regimen   Acute metabolic encephalopathy     - 2/2 sepsis, hypercarbic respiratory failure     - patient is well conscious oriented x3  Acute on chronic diastolic congestive heart failure     - continue with lasix.   Obstructive sleep apnea     - noncompliant with the CPAP at home     - had discussions regarding trach placement, family refused  Sepsis secondary to tracheobronchitis, UTI     - blood cultures 1/2 positive for Enterococcus species     - appreciate ID consult      - completed vancomycin regimen  COPD exacerbation     - continue the breathing treatments, antibiotics, Solu-Medrol  Morbid obesity     - patient has BMI of 110      - patient will benefit from surgical options of gastric bypass surgery  Severe debility      - PT eval recommending SNF.    DVT prophylaxis: heparin.  Code Status: FULL CODE.  Family Communication: NONE AT BEDSIDE.  Disposition Plan: pending clinical improvement and SNF  Consultants:   ID  PCCM  Procedures: none.  Antimicrobials: none.   Subjective:no chest pain or sob.   Objective: Vitals:   11/02/18 0313 11/02/18 0707 11/02/18 0822 11/02/18 1434  BP: (!) 117/58 109/65    Pulse: 80 86 77 83  Resp: 18 (!) 22 18 20   Temp:  97.8 F (36.6 C)    TempSrc:  Oral    SpO2: 98% 97% 99% 97%  Weight:      Height:        Intake/Output Summary (Last 24 hours) at 11/02/2018 1746 Last data filed at 11/02/2018 1300 Gross per  24 hour  Intake -  Output 2550 ml  Net -2550 ml   Filed Weights   10/29/18 0431 10/30/18 0251 10/31/18 0500  Weight: (!) 233.6 kg (!) 234.5 kg (!) 228.2 kg    Examination:  General exam: morbidly obese, comfortable,  Respiratory system: diminished at bases,  Cardiovascular system: S1 & S2 heard, RRR. No JVD,Gastrointestinal system: Abdomen is nondistended, soft and nontender. No organomegaly or masses felt. Normal bowel sounds heard. Central nervous system: Alert and oriented. No focal neurological deficits. Extremities: Symmetric 5 x 5 power. Skin: No rashes, lesions or ulcers Psychiatry:. Mood & affect appropriate.     Data Reviewed: I have personally reviewed following labs and imaging studies  CBC: Recent Labs  Lab 10/29/18 0422 10/30/18 0524 10/31/18 0619 10/31/18 1131 11/01/18 0445 11/02/18 0412  WBC 5.8 7.4 8.6  --  6.9 7.3  NEUTROABS 4.8  --   --   --  5.8 6.1  HGB 9.5* 9.4* 10.5* 13.6 10.4* 11.0*  HCT 33.4* 33.2* 36.9 40.0 36.6 38.8  MCV 94.6 95.1 96.9  --  95.6 94.9  PLT 137* 139* 152  --  143* 152   Basic Metabolic Panel: Recent Labs  Lab 10/28/18 0406 10/29/18 0422 10/30/18 0524 10/31/18 0619 10/31/18 1131 10/31/18 2344 11/01/18 0445 11/02/18 0412  NA 145 144 146* 149* 142 143  --  142  K 5.0 3.8 4.2 4.6 4.5 4.7  --  4.9  CL 102 101 101 100  --  98  --  100  CO2 32 33* 33* 39*  --  35*  --  34*  GLUCOSE 135* 161* 156* 132*  --  119*  --  151*  BUN 64* 74* 84* 87*  --  70*  --  53*  CREATININE 1.61* 1.40* 1.46* 1.47*  --  1.16*  --  1.19*  CALCIUM 8.6* 8.2* 7.8* 7.8*  --  8.0*  --  8.6*  MG 2.3 2.5*  --  2.4  --   --  2.3 2.5*  PHOS 3.9 4.1  --  5.4*  --   --  3.8 3.7   GFR: Estimated Creatinine Clearance: 94.2 mL/min (A) (by C-G formula based on SCr of 1.19 mg/dL (H)). Liver Function Tests: Recent Labs  Lab 10/27/18 0351 10/28/18 0406 10/28/18 1218 11/02/18 0412  AST 20 30 35  --   ALT 16 24 29   --   ALKPHOS 74 58 63  --   BILITOT  0.7 0.8 0.6  --   PROT 7.5 7.0 7.0  --   ALBUMIN 2.6* 2.4* 2.5* 2.5*   No results for input(s): LIPASE, AMYLASE in the last 168 hours. No results for input(s): AMMONIA in the last 168 hours. Coagulation Profile: No results for input(s): INR, PROTIME in the last 168 hours. Cardiac Enzymes: Recent Labs  Lab 11/01/18 0445  TROPONINI 0.04*   BNP (last 3 results) No results for input(s):  PROBNP in the last 8760 hours. HbA1C: No results for input(s): HGBA1C in the last 72 hours. CBG: Recent Labs  Lab 11/02/18 0045 11/02/18 0422 11/02/18 0749 11/02/18 1142 11/02/18 1647  GLUCAP 117* 134* 118* 143* 113*   Lipid Profile: No results for input(s): CHOL, HDL, LDLCALC, TRIG, CHOLHDL, LDLDIRECT in the last 72 hours. Thyroid Function Tests: No results for input(s): TSH, T4TOTAL, FREET4, T3FREE, THYROIDAB in the last 72 hours. Anemia Panel: No results for input(s): VITAMINB12, FOLATE, FERRITIN, TIBC, IRON, RETICCTPCT in the last 72 hours. Sepsis Labs: Recent Labs  Lab 10/31/18 2344  LATICACIDVEN 0.8    Recent Results (from the past 240 hour(s))  MRSA PCR Screening     Status: None   Collection Time: 10/25/18  2:28 AM   Specimen: Nasal Mucosa; Nasopharyngeal  Result Value Ref Range Status   MRSA by PCR NEGATIVE NEGATIVE Final    Comment:        The GeneXpert MRSA Assay (FDA approved for NASAL specimens only), is one component of a comprehensive MRSA colonization surveillance program. It is not intended to diagnose MRSA infection nor to guide or monitor treatment for MRSA infections. Performed at [facility], [place] [address], [place], [place] [address]   Culture, respiratory (non-expectorated)     Status: None   Collection Time: 10/25/18  2:28 AM   Specimen: Tracheal Aspirate; Respiratory  Result Value Ref Range Status   Specimen Description TRACHEAL ASPIRATE  Final   Special Requests NONE  Final   Gram Stain   Final    FEW WBC PRESENT,BOTH PMN AND MONONUCLEAR  RARE GRAM POSITIVE COCCI IN PAIRS Performed at [facility], [place] [address], [place], [place] [address]    Culture RARE STAPHYLOCOCCUS AUREUS  Final   Report Status 10/28/2018 FINAL  Final   Organism ID, Bacteria STAPHYLOCOCCUS AUREUS  Final      Susceptibility   Staphylococcus aureus - MIC*    CIPROFLOXACIN <=0.5 SENSITIVE Sensitive     ERYTHROMYCIN >=8 RESISTANT Resistant     GENTAMICIN <=0.5 SENSITIVE Sensitive     OXACILLIN 0.5 SENSITIVE Sensitive     TETRACYCLINE <=1 SENSITIVE Sensitive     VANCOMYCIN <=0.5 SENSITIVE Sensitive     TRIMETH/SULFA <=10 SENSITIVE Sensitive     CLINDAMYCIN RESISTANT Resistant     RIFAMPIN <=0.5 SENSITIVE Sensitive     Inducible Clindamycin POSITIVE Resistant     * RARE STAPHYLOCOCCUS AUREUS  Culture, blood (routine x 2)     Status: None   Collection Time: 10/25/18  2:40 AM   Specimen: BLOOD LEFT HAND  Result Value Ref Range Status   Specimen Description BLOOD LEFT HAND  Final   Special Requests   Final    BOTTLES DRAWN AEROBIC ONLY Blood Culture adequate volume   Culture   Final    NO GROWTH 5 DAYS Performed at [facility], [address], [place], [place] [address]    Report Status 10/30/2018 FINAL  Final  SARS Coronavirus 2     Status: None   Collection Time: 10/25/18  2:48 AM  Result Value Ref Range Status   SARS Coronavirus 2 NOT DETECTED NOT DETECTED Final    Comment: (NOTE) SARS-CoV-2 target nucleic acids are NOT DETECTED. The SARS-CoV-2 RNA is generally detectable in upper and lower respiratory specimens during the acute phase of infection.  Negative  results do not preclude SARS-CoV-2 infection, do not rule out co-infections with other pathogens, and should not be used as the  sole basis for treatment or other patient management decisions.  Negative results must be combined with clinical observations, patient history, and epidemiological information. The expected result is Not Detected. Fact Sheet for Patients:  http://www.biofiredefense.com/wp-content/uploads/2020/03/BIOFIRE-COVID -19-patients.pdf Fact Sheet for Healthcare Providers: http://www.biofiredefense.com/wp-content/uploads/2020/03/BIOFIRE-COVID -19-hcp.pdf This test is not yet approved or cleared by the [place] FDA and  has been authorized for detection and/or diagnosis of SARS-CoV-2 by FDA under an Emergency Use Authorization (EUA).  This EUA will remain in effec t (meaning this test can be used) for the duration of  the COVID-19 declaration under Section 564(b)(1) of the Act, 21 U.S.C. section 360bbb-3(b)(1), unless the authorization is terminated or revoked sooner. Performed at [facility], [address] [place], [place] [address]   Culture, blood (routine x 2)     Status: None   Collection Time: 10/25/18  2:50 AM   Specimen: BLOOD LEFT HAND  Result Value Ref Range Status   Specimen Description BLOOD LEFT HAND  Final   Special Requests   Final    BOTTLES DRAWN AEROBIC ONLY Blood Culture adequate volume   Culture   Final    NO GROWTH 5 DAYS Performed at [facility], [address] [place], [place] [address]    Report Status 10/30/2018 FINAL  Final  Urine culture     Status: None   Collection Time: 10/25/18  5:03 AM   Specimen: Urine, Random  Result Value Ref Range Status   Specimen Description URINE, RANDOM  Final   Special Requests NONE  Final   Culture   Final    NO GROWTH Performed at [facility], [address] [place], [place] [address]    Report Status 10/26/2018 FINAL  Final         Radiology Studies: No results found.      Scheduled Meds: . arformoterol  15 mcg Nebulization BID  . budesonide (PULMICORT) nebulizer solution  0.5 mg Nebulization BID  . busPIRone  30 mg Oral BID  . chlorhexidine  15 mL Mouth Rinse BID  . Chlorhexidine Gluconate Cloth  6 each Topical Daily  . docusate sodium  100 mg Oral BID  . furosemide  40 mg Oral Daily  . heparin  5,000 Units  Subcutaneous Q8H  . insulin aspart  0-9 Units Subcutaneous Q4H  . ipratropium-albuterol  3 mL Nebulization TID  . mouth rinse  15 mL Mouth Rinse q12n4p  . methylPREDNISolone (SOLU-MEDROL) injection  40 mg Intravenous Q8H  . multivitamin with minerals  1 tablet Oral Daily  . pantoprazole (PROTONIX) IV  40 mg Intravenous Q12H  . Ensure Max Protein  11 oz Oral Daily  . senna  1 tablet Oral BID  . sertraline  25 mg Oral Daily  . sodium chloride flush  10-40 mL Intracatheter Q12H  . topiramate  50 mg Oral QHS   Continuous Infusions:   LOS: 8 days    Time spent:35 min    Kathlen ModyVijaya Elena Cothern, MD [facility] Pager [phone]   If 7PM-7AM, please contact night-coverage [email or web address] Password [email or web address] 11/02/2018, 5:46 PM

## 2018-11-02 NOTE — Evaluation (Signed)
Physical Therapy Evaluation Patient Details Name: Laura PilotRosa Stewart MRN: [id number] DOB: 05/27/1960 Today's Date: 11/02/2018   History of Present Illness  Patient presented to the hospital with ARD on 10/25/2018 with ARF. At this time she is also having vaginal bleeding. She presents with morbid obesity.   Clinical Impression  Patient had a low baseline mobility but she was able to get up to the edge of the bed. At this time she is unable despite total assist+2. She was able to shift in the bed with mod a and roll with max but therapy was unable to actually get her sitting. She is on an air mattress which makes it more difficulty but she still will likely require a multiple person assist to move at home. She would benefit most from rehab at a SNF. Acute therapy will continue to work with the patient.      Follow Up Recommendations SNF    Equipment Recommendations  (may need a lift is she goes home )    Recommendations for Other Services Rehab consult     Precautions / Restrictions Precautions Precautions: Fall Restrictions Weight Bearing Restrictions: No      Mobility  Bed Mobility Overal bed mobility: Needs Assistance Bed Mobility: Rolling;Supine to Sit Rolling: Max assist;+2 for physical assistance;+2 for safety/equipment   Supine to sit: Total assist;+2 for physical assistance;+2 for safety/equipment     General bed mobility comments: Patient required max a +2 to roll. SHe was then able to scoot her legs towards the edge of the bed with max a but she was unable to sit up despite +2 total assistance. She was fatigued after transffer. She is on an air matress which makes it somewhat difficultto get her up on the edge.   Transfers                 General transfer comment: did not transfer at baseline   Ambulation/Gait                Stairs            Wheelchair Mobility    Modified Rankin (Stroke Patients Only)       Balance                                             Pertinent Vitals/Pain      Home Living Family/patient expects to be discharged to:: Skilled nursing facility Living Arrangements: Children Available Help at Discharge: Family Type of Home: House Home Access: Ramped entrance     Home Layout: One level Home Equipment: Environmental consultantWalker - 2 wheels;Wheelchair - manual Additional Comments: Patient reports she has equipment but has not used it in some time     Prior Function Level of Independence: Needs assistance   Gait / Transfers Assistance Needed: non-ambulatory   ADL's / Homemaking Assistance Needed: daughter assists her with all mobility         Hand Dominance   Dominant Hand: Right    Extremity/Trunk Assessment   Upper Extremity Assessment Upper Extremity Assessment: Overall WFL for tasks assessed    Lower Extremity Assessment Lower Extremity Assessment: (unable to assess 2nd to large habitus )    Cervical / Trunk Assessment Cervical / Trunk Assessment: Normal  Communication   Communication: No difficulties  Cognition Arousal/Alertness: Awake/alert Behavior During Therapy: WFL for tasks assessed/performed Overall Cognitive Status: Within Functional Limits  for tasks assessed                                        General Comments General comments (skin integrity, edema, etc.): large habitus     Exercises     Assessment/Plan    PT Assessment Patient needs continued PT services  PT Problem List Decreased strength;Decreased range of motion;Decreased activity tolerance;Obesity;Decreased balance;Decreased mobility;Decreased knowledge of use of DME;Decreased safety awareness       PT Treatment Interventions DME instruction;Gait training;Functional mobility training;Therapeutic activities;Therapeutic exercise;Neuromuscular re-education;Patient/family education    PT Goals (Current goals can be found in the Care Plan section)  Acute Rehab PT Goals Patient Stated Goal:  to sit up at the edge of the bed  PT Goal Formulation: With patient Time For Goal Achievement: 11/16/18 Potential to Achieve Goals: Good    Frequency Min 2X/week   Barriers to discharge        Co-evaluation               AM-PAC PT "6 Clicks" Mobility  Outcome Measure Help needed turning from your back to your side while in a flat bed without using bedrails?: A Lot Help needed moving from lying on your back to sitting on the side of a flat bed without using bedrails?: Total Help needed moving to and from a bed to a chair (including a wheelchair)?: Total Help needed standing up from a chair using your arms (e.g., wheelchair or bedside chair)?: Total Help needed to walk in hospital room?: Total Help needed climbing 3-5 steps with a railing? : Total 6 Click Score: 7    End of Session   Activity Tolerance: Patient limited by fatigue Patient left: in bed;with call bell/phone within reach;with bed alarm set Nurse Communication: Mobility status PT Visit Diagnosis: Other abnormalities of gait and mobility (R26.89)    Time: 1052-1110 PT Time Calculation (min) (ACUTE ONLY): 18 min   Charges:   PT Evaluation $PT Eval Moderate Complexity: 1 Mod            Carney Living PT DPT  11/02/2018, 2:32 PM

## 2018-11-02 NOTE — Progress Notes (Signed)
RT place patient on BIPAP. Patient tolerating well at this time. RT will monitor as needed.

## 2018-11-02 NOTE — Progress Notes (Signed)
Nutrition Follow-up  RD working remotely.  DOCUMENTATION CODES:   Morbid obesity  INTERVENTION:   -Continue Ensure Max po daily, each supplement provides 150 kcal and 30 grams of protein.  -MVI with minerals daily  NUTRITION DIAGNOSIS:   Increased nutrient needs related to chronic illness as evidenced by estimated needs.  Ongoing  GOAL:   Patient will meet greater than or equal to 90% of their needs  Progressing  MONITOR:   PO intake, Supplement acceptance, Diet advancement, Labs, Weight trends, Skin, I & O's  REASON FOR ASSESSMENT:   Consult Enteral/tube feeding initiation and management  ASSESSMENT:   58 yo female admitted with acute respiratory failure requiring intubation. PMH includes super morbid obesity, COPD, pulmonary HTN, OSA, HF.  6/14- extubated  6/16- s/p BSE- advanced to dysphagia 3 diet with thin liquids, due to poor dentition  Reviewed I/O's: -3.5 L x 24 hours and -15.8 L since admission  UOP: 3.5 L x 24 hours  Attempted to speak with pt via room phone, however, no answer. RD unable to obtain further nutrition-related history at this time.   Pt was just advanced to a dysphagia 3 diet with thin liquids. Pt with poor dentition, so would benefit from a mechanical soft diet. Noted pt refused Ensure Max supplement this AM, but accepted prior doses.   Pt with morbid obesity, which is a complex chronic medical issue that requires input from a multi-disciplinary care team. Weight loss is not an idea goal for an acute hospitalization. If further work-up for obesity is warranted, consider outpatient referral to bariatrics service and/od [facility].   Per MD notes, pt is awaiting PT evaluation to further evaluate most appropriate discharge disposition. Pt with severe debility per chart review.   Medications reviewed and include solu-medrol.   Labs reviewed: Mg: 2.5, CBGS: 117-134 (inpatient orders for glycemic  control are 0-9 units insulin aspart every 4 hours).   Diet Order:   Diet Order            DIET DYS 3 Room service appropriate? Yes; Fluid consistency: Thin  Diet effective now              EDUCATION NEEDS:   Not appropriate for education at this time  Skin:  Skin Assessment: Reviewed RN Assessment  Last BM:  Unknown  Height:   Ht Readings from Last 1 Encounters:  10/25/18 4\' 10"  (1.473 m)    Weight:   Wt Readings from Last 1 Encounters:  10/31/18 (!) 228.2 kg    Ideal Body Weight:  43.9 kg  BMI:  Body mass index is 105.13 kg/m.  Estimated Nutritional Needs:   Kcal:  1550-1750  Protein:  95-110 grams  Fluid:  > 1.5 L    Arva Slaugh A. Jimmye Norman, RD, LDN, [place] Registered Dietitian II Certified Diabetes Care and Education Specialist Pager: [phone] After hours Pager: [phone]

## 2018-11-03 LAB — URINALYSIS, ROUTINE W REFLEX MICROSCOPIC
Bilirubin Urine: NEGATIVE
Glucose, UA: NEGATIVE mg/dL
Hgb urine dipstick: NEGATIVE
Ketones, ur: NEGATIVE mg/dL
Nitrite: NEGATIVE
Protein, ur: NEGATIVE mg/dL
Specific Gravity, Urine: 1.01 (ref 1.005–1.030)
pH: 7 (ref 5.0–8.0)

## 2018-11-03 LAB — CBC WITH DIFFERENTIAL/PLATELET
Abs Immature Granulocytes: 0.14 10*3/uL — ABNORMAL HIGH (ref 0.00–0.07)
Basophils Absolute: 0 10*3/uL (ref 0.0–0.1)
Basophils Relative: 0 %
Eosinophils Absolute: 0 10*3/uL (ref 0.0–0.5)
Eosinophils Relative: 0 %
HCT: 36.8 % (ref 36.0–46.0)
Hemoglobin: 10.5 g/dL — ABNORMAL LOW (ref 12.0–15.0)
Immature Granulocytes: 2 %
Lymphocytes Relative: 15 %
Lymphs Abs: 1.4 10*3/uL (ref 0.7–4.0)
MCH: 27 pg (ref 26.0–34.0)
MCHC: 28.5 g/dL — ABNORMAL LOW (ref 30.0–36.0)
MCV: 94.6 fL (ref 80.0–100.0)
Monocytes Absolute: 1.1 10*3/uL — ABNORMAL HIGH (ref 0.1–1.0)
Monocytes Relative: 11 %
Neutro Abs: 6.9 10*3/uL (ref 1.7–7.7)
Neutrophils Relative %: 72 %
Platelets: 158 10*3/uL (ref 150–400)
RBC: 3.89 MIL/uL (ref 3.87–5.11)
RDW: 14.6 % (ref 11.5–15.5)
WBC: 9.6 10*3/uL (ref 4.0–10.5)
nRBC: 0 % (ref 0.0–0.2)

## 2018-11-03 LAB — PHOSPHORUS: Phosphorus: 3.6 mg/dL (ref 2.5–4.6)

## 2018-11-03 LAB — GLUCOSE, CAPILLARY
Glucose-Capillary: 112 mg/dL — ABNORMAL HIGH (ref 70–99)
Glucose-Capillary: 124 mg/dL — ABNORMAL HIGH (ref 70–99)
Glucose-Capillary: 131 mg/dL — ABNORMAL HIGH (ref 70–99)
Glucose-Capillary: 78 mg/dL (ref 70–99)
Glucose-Capillary: 84 mg/dL (ref 70–99)
Glucose-Capillary: 85 mg/dL (ref 70–99)

## 2018-11-03 LAB — MAGNESIUM: Magnesium: 2.4 mg/dL (ref 1.7–2.4)

## 2018-11-03 NOTE — Progress Notes (Signed)
PROGRESS NOTE    Laura PilotRosa Hartland  JXB:[id number] DOB: 03/06/1961 DOA: 10/25/2018 PCP: No primary care provider on file.    Brief Narrative:  58 year old female with history of morbid obesity with BMI 110, chronic respiratory failure on 3L [place], COPD, Pulmonary hypertension, OSA, non-compliant with CPAP, and Diastolic HF transferred from [facility] for acute on chronic respiratory failure.   She was found by EMS unresponsive at home with hypoxia in the 50's. On arrival to [facility], she remained hypoxic on NRB with ABG showing pH 7.12, pCO2 125.7, PO2 44.3. She was given lasix and required intubation. She had tranisent hypotension on fentanyl and versed drips requiring levophed briefly. CXR showed no infiltrate. Workup noted for normal EKG with neg troponin, BNP 79, lower extremity ultrasound was negative for DVT, BMP showed mild hyperkalemia of 5.9 which was treated with kayexalate, otherwise normal. UA showed moderate leukocytes with 6-10 WBC, WBC 9, normal procalcitonin, UDS negative, Ammonia 41, normal LFTs, COVID negative. Limited echo showed grade 2 diastolic dysfunction with EF 50-55%. She was started on ceftriaxone for UTI and azithromycin for possible COPD exacerbation. Blood cultures obtained showed GPC and enterococcus species and therefore started on vancomycin. She was [facility] to [facility] for bariatric resources since patient's BMI exceeded their bariatric capabilities.Blood cultures were positive for Enterococcus species. Infectious Diseases. Patient developed gradual improvement of the mental status. The acute encephalopathy metabolic was related to hypercarbia. Patient is extubated on 10/31/2018, placed on BiPAP. Patient at baseline uses BiPAP while in the bed. Per patient uses 4 L of oxygen through nasal cannula at baseline.   Pt seen and examined today. She is currently on 5 lit of [place] oxygen.   Assessment & Plan:   Active Problems:   Acute  respiratory failure (HCC)   Enterococcal bacteremia   Acute metabolic encephalopathy   COPD with acute exacerbation (HCC)   Debility    Acute on chronic hypoxic, hypercarbic respiratory failure     - patient has been noncompliant with CPAP     - admitted with pCO2 of 115      - improved with the intubation     - patient is treated as community-acquired pneumonia, COPD exacerbation with vancomycin and aztreonam.     - extubated on 10/31/2018, currently on 5 lit of [place] oxygen.      - completed vanc regimen   Acute metabolic encephalopathy     - 2/2 sepsis, hypercarbic respiratory failure     - patient is well conscious oriented x3  Acute on chronic diastolic congestive heart failure     - continue with lasix.   Obstructive sleep apnea     - noncompliant with the CPAP at home     - had discussions regarding trach placement, family refused  Sepsis secondary to tracheobronchitis, UTI     - blood cultures 1/2 positive for Enterococcus species     - appreciate ID consult      - completed vancomycin regimen  COPD exacerbation     - continue the breathing treatments, antibiotics, Solu-Medrol - tapere solumedrol to prednisone for the next few days.  No wheezing heard.    Morbid obesity     - patient has BMI of 110      - patient will benefit from surgical options of gastric bypass surgery  Severe debility      - PT eval recommending SNF.    DVT prophylaxis: heparin.  Code Status: FULL CODE.  Family Communication: NONE AT  BEDSIDE.  Disposition Plan: stable for  Discharge to SNF tomorrow.   Consultants:   ID  PCCM  Procedures: none.  Antimicrobials: none.   Subjective:no chest pain or sob. Slightly sleepy.   Objective: Vitals:   11/03/18 0843 11/03/18 0845 11/03/18 1240 11/03/18 1630  BP: (!) 86/46 (!) 101/51 115/62 130/64  Pulse: 93 96 81 82  Resp: (!) 21 (!) 21 20 18   Temp:  98.1 F (36.7 C)    TempSrc:  Oral    SpO2: 95% 95% 93% 98%  Weight:       Height:        Intake/Output Summary (Last 24 hours) at 11/03/2018 1803 Last data filed at 11/03/2018 1634 Gross per 24 hour  Intake 1016 ml  Output 1625 ml  Net -609 ml   Filed Weights   10/29/18 0431 10/30/18 0251 10/31/18 0500  Weight: (!) 233.6 kg (!) 234.5 kg (!) 228.2 kg    Examination:  General exam: morbidly obese, comfortable,  Respiratory system: diminished at bases, no wheezing heard.  Cardiovascular system: S1 & S2 heard, RRR. No JVD, Gastrointestinal system: Abdomen is soft NT ND BS+ Central nervous system: Alert and oriented. No focal neurological deficits. Extremities: Symmetric 5 x 5 power. Skin: No rashes, lesions or ulcers Psychiatry:. Mood & affect appropriate.     Data Reviewed: I have personally reviewed following labs and imaging studies  CBC: Recent Labs  Lab 10/29/18 0422 10/30/18 0524 10/31/18 0619 10/31/18 1131 11/01/18 0445 11/02/18 0412 11/03/18 0333  WBC 5.8 7.4 8.6  --  6.9 7.3 9.6  NEUTROABS 4.8  --   --   --  5.8 6.1 6.9  HGB 9.5* 9.4* 10.5* 13.6 10.4* 11.0* 10.5*  HCT 33.4* 33.2* 36.9 40.0 36.6 38.8 36.8  MCV 94.6 95.1 96.9  --  95.6 94.9 94.6  PLT 137* 139* 152  --  143* 152 158   Basic Metabolic Panel: Recent Labs  Lab 10/29/18 0422 10/30/18 0524 10/31/18 0619 10/31/18 1131 10/31/18 2344 11/01/18 0445 11/02/18 0412 11/03/18 0333  NA 144 146* 149* 142 143  --  142  --   K 3.8 4.2 4.6 4.5 4.7  --  4.9  --   CL 101 101 100  --  98  --  100  --   CO2 33* 33* 39*  --  35*  --  34*  --   GLUCOSE 161* 156* 132*  --  119*  --  151*  --   BUN 74* 84* 87*  --  70*  --  53*  --   CREATININE 1.40* 1.46* 1.47*  --  1.16*  --  1.19*  --   CALCIUM 8.2* 7.8* 7.8*  --  8.0*  --  8.6*  --   MG 2.5*  --  2.4  --   --  2.3 2.5* 2.4  PHOS 4.1  --  5.4*  --   --  3.8 3.7 3.6   GFR: Estimated Creatinine Clearance: 94.2 mL/min (A) (by C-G formula based on SCr of 1.19 mg/dL (H)). Liver Function Tests: Recent Labs  Lab 10/28/18 0406  10/28/18 1218 11/02/18 0412  AST 30 35  --   ALT 24 29  --   ALKPHOS 58 63  --   BILITOT 0.8 0.6  --   PROT 7.0 7.0  --   ALBUMIN 2.4* 2.5* 2.5*   No results for input(s): LIPASE, AMYLASE in the last 168 hours. No results for input(s): AMMONIA in the last  168 hours. Coagulation Profile: No results for input(s): INR, PROTIME in the last 168 hours. Cardiac Enzymes: Recent Labs  Lab 11/01/18 0445  TROPONINI 0.04*   BNP (last 3 results) No results for input(s): PROBNP in the last 8760 hours. HbA1C: No results for input(s): HGBA1C in the last 72 hours. CBG: Recent Labs  Lab 11/03/18 0028 11/03/18 0413 11/03/18 0744 11/03/18 1215 11/03/18 1623  GLUCAP 112* 84 85 131* 124*   Lipid Profile: No results for input(s): CHOL, HDL, LDLCALC, TRIG, CHOLHDL, LDLDIRECT in the last 72 hours. Thyroid Function Tests: No results for input(s): TSH, T4TOTAL, FREET4, T3FREE, THYROIDAB in the last 72 hours. Anemia Panel: No results for input(s): VITAMINB12, FOLATE, FERRITIN, TIBC, IRON, RETICCTPCT in the last 72 hours. Sepsis Labs: Recent Labs  Lab 10/31/18 2344  LATICACIDVEN 0.8    Recent Results (from the past 240 hour(s))  MRSA PCR Screening     Status: None   Collection Time: 10/25/18  2:28 AM   Specimen: Nasal Mucosa; Nasopharyngeal  Result Value Ref Range Status   MRSA by PCR NEGATIVE NEGATIVE Final    Comment:        The GeneXpert MRSA Assay (FDA approved for NASAL specimens only), is one component of a comprehensive MRSA colonization surveillance program. It is not intended to diagnose MRSA infection nor to guide or monitor treatment for MRSA infections. Performed at [facility], [address] [place], [place] [address]   Culture, respiratory (non-expectorated)     Status: None   Collection Time: 10/25/18  2:28 AM   Specimen: Tracheal Aspirate; Respiratory  Result Value Ref Range Status   Specimen Description TRACHEAL ASPIRATE  Final   Special Requests  NONE  Final   Gram Stain   Final    FEW WBC PRESENT,BOTH PMN AND MONONUCLEAR RARE GRAM POSITIVE COCCI IN PAIRS Performed at [facility], [address] [place], [place] [address]    Culture RARE STAPHYLOCOCCUS AUREUS  Final   Report Status 10/28/2018 FINAL  Final   Organism ID, Bacteria STAPHYLOCOCCUS AUREUS  Final      Susceptibility   Staphylococcus aureus - MIC*    CIPROFLOXACIN <=0.5 SENSITIVE Sensitive     ERYTHROMYCIN >=8 RESISTANT Resistant     GENTAMICIN <=0.5 SENSITIVE Sensitive     OXACILLIN 0.5 SENSITIVE Sensitive     TETRACYCLINE <=1 SENSITIVE Sensitive     VANCOMYCIN <=0.5 SENSITIVE Sensitive     TRIMETH/SULFA <=10 SENSITIVE Sensitive     CLINDAMYCIN RESISTANT Resistant     RIFAMPIN <=0.5 SENSITIVE Sensitive     Inducible Clindamycin POSITIVE Resistant     * RARE STAPHYLOCOCCUS AUREUS  Culture, blood (routine x 2)     Status: None   Collection Time: 10/25/18  2:40 AM   Specimen: BLOOD LEFT HAND  Result Value Ref Range Status   Specimen Description BLOOD LEFT HAND  Final   Special Requests   Final    BOTTLES DRAWN AEROBIC ONLY Blood Culture adequate volume   Culture   Final    NO GROWTH 5 DAYS Performed at [facility], [address] [place], [place] [address]    Report Status 10/30/2018 FINAL  Final  SARS Coronavirus 2     Status: None   Collection Time: 10/25/18  2:48 AM  Result Value Ref Range Status   SARS Coronavirus 2 NOT DETECTED NOT DETECTED Final    Comment: (NOTE) SARS-CoV-2 target nucleic acids are NOT DETECTED. The SARS-CoV-2 RNA is generally detectable  in upper and lower respiratory specimens during the acute phase of infection.  Negative  results do not preclude SARS-CoV-2 infection, do not rule out co-infections with other pathogens, and should not be used as the sole basis for treatment or other patient management decisions.  Negative results must be combined with clinical observations, patient history, and epidemiological  information. The expected result is Not Detected. Fact Sheet for Patients: http://www.biofiredefense.com/wp-content/uploads/2020/03/BIOFIRE-COVID -19-patients.pdf Fact Sheet for Healthcare Providers: http://www.biofiredefense.com/wp-content/uploads/2020/03/BIOFIRE-COVID -19-hcp.pdf This test is not yet approved or cleared by the [place] and  has been authorized for detection and/or diagnosis of SARS-CoV-2 by FDA under an Emergency Use Authorization (EUA).  This EUA will remain in effec t (meaning this test can be used) for the duration of  the COVID-19 declaration under Section 564(b)(1) of the Act, 21 U.S.C. section 360bbb-3(b)(1), unless the authorization is terminated or revoked sooner. Performed at [facility], [place] [address] [place], [place] [address]   Culture, blood (routine x 2)     Status: None   Collection Time: 10/25/18  2:50 AM   Specimen: BLOOD LEFT HAND  Result Value Ref Range Status   Specimen Description BLOOD LEFT HAND  Final   Special Requests   Final    BOTTLES DRAWN AEROBIC ONLY Blood Culture adequate volume   Culture   Final    NO GROWTH 5 DAYS Performed at [facility], [place] [address], [place], [place] [address]    Report Status 10/30/2018 FINAL  Final  Urine culture     Status: None   Collection Time: 10/25/18  5:03 AM   Specimen: Urine, Random  Result Value Ref Range Status   Specimen Description URINE, RANDOM  Final   Special Requests NONE  Final   Culture   Final    NO GROWTH Performed at [facility], [place] [address] [place], [place] [address]    Report Status 10/26/2018 FINAL  Final         Radiology Studies: No results found.      Scheduled Meds: . arformoterol  15 mcg Nebulization BID  . budesonide (PULMICORT) nebulizer solution  0.5 mg Nebulization BID  . busPIRone  30 mg Oral BID  . chlorhexidine  15 mL Mouth Rinse BID  . Chlorhexidine Gluconate Cloth  6 each Topical Daily  . docusate sodium   100 mg Oral BID  . furosemide  40 mg Oral Daily  . heparin  5,000 Units Subcutaneous Q8H  . insulin aspart  0-9 Units Subcutaneous Q4H  . mouth rinse  15 mL Mouth Rinse q12n4p  . methylPREDNISolone (SOLU-MEDROL) injection  40 mg Intravenous Q24H  . multivitamin with minerals  1 tablet Oral Daily  . pantoprazole (PROTONIX) IV  40 mg Intravenous Q12H  . Ensure Max Protein  11 oz Oral Daily  . senna  1 tablet Oral BID  . sertraline  25 mg Oral Daily  . sodium chloride flush  10-40 mL Intracatheter Q12H  . topiramate  50 mg Oral QHS   Continuous Infusions:   LOS: 9 days    Time spent:30 min    Hosie Poisson, MD [facility] Pager [phone]   If 7PM-7AM, please contact night-coverage [email or web address] Password [facility] 11/03/2018, 6:03 PM

## 2018-11-03 NOTE — Progress Notes (Signed)
During night patient exhibited mild disorientation, easily distracted to movement outside room, asking about grandchildren being present at hospital. Sometimes talking out loud to no distinct person.   Patient stated will start bipap at 0230. Notified RT at [place] to start [place] at [place].   Rounded at 0220, patient awake using cellphone.   Notified by NT at 0415 patient removed bipap. RN to bedside. 3L [place] on. Patient somewhat disoriented asking about grandson and other children, reoriented patient and informed patient in hospital room and only staff present.   Patient complained of discomfort at foley site, pulling sensation, removed foley tubing from statlock.CHG, foley care completed at 0630. Scant amount of serosang blood observed from vaginal area on sanitary pad. Informed day RN.

## 2018-11-04 ENCOUNTER — Encounter (HOSPITAL_COMMUNITY): Payer: Self-pay | Admitting: General Practice

## 2018-11-04 ENCOUNTER — Other Ambulatory Visit: Payer: Self-pay

## 2018-11-04 DIAGNOSIS — G9341 Metabolic encephalopathy: Secondary | ICD-10-CM

## 2018-11-04 LAB — CBC WITH DIFFERENTIAL/PLATELET
Abs Immature Granulocytes: 0.12 10*3/uL — ABNORMAL HIGH (ref 0.00–0.07)
Basophils Absolute: 0 10*3/uL (ref 0.0–0.1)
Basophils Relative: 0 %
Eosinophils Absolute: 0.2 10*3/uL (ref 0.0–0.5)
Eosinophils Relative: 2 %
HCT: 36.7 % (ref 36.0–46.0)
Hemoglobin: 10.5 g/dL — ABNORMAL LOW (ref 12.0–15.0)
Immature Granulocytes: 2 %
Lymphocytes Relative: 26 %
Lymphs Abs: 2.1 10*3/uL (ref 0.7–4.0)
MCH: 27.2 pg (ref 26.0–34.0)
MCHC: 28.6 g/dL — ABNORMAL LOW (ref 30.0–36.0)
MCV: 95.1 fL (ref 80.0–100.0)
Monocytes Absolute: 0.8 10*3/uL (ref 0.1–1.0)
Monocytes Relative: 10 %
Neutro Abs: 5 10*3/uL (ref 1.7–7.7)
Neutrophils Relative %: 60 %
Platelets: 166 10*3/uL (ref 150–400)
RBC: 3.86 MIL/uL — ABNORMAL LOW (ref 3.87–5.11)
RDW: 14.5 % (ref 11.5–15.5)
WBC: 8.2 10*3/uL (ref 4.0–10.5)
nRBC: 0 % (ref 0.0–0.2)

## 2018-11-04 LAB — PHOSPHORUS: Phosphorus: 3.7 mg/dL (ref 2.5–4.6)

## 2018-11-04 LAB — GLUCOSE, CAPILLARY
Glucose-Capillary: 102 mg/dL — ABNORMAL HIGH (ref 70–99)
Glucose-Capillary: 118 mg/dL — ABNORMAL HIGH (ref 70–99)
Glucose-Capillary: 132 mg/dL — ABNORMAL HIGH (ref 70–99)
Glucose-Capillary: 78 mg/dL (ref 70–99)
Glucose-Capillary: 88 mg/dL (ref 70–99)

## 2018-11-04 LAB — MAGNESIUM: Magnesium: 2.3 mg/dL (ref 1.7–2.4)

## 2018-11-04 MED ORDER — ENSURE MAX PROTEIN PO LIQD: 11.0000 [oz_av] | Freq: Every day | ORAL | Status: AC

## 2018-11-04 MED ORDER — SENNA 8.6 MG PO TABS: 1.0000 | ORAL_TABLET | Freq: Two times a day (BID) | ORAL | 0 refills | Status: AC

## 2018-11-04 MED ORDER — ADULT MULTIVITAMIN W/MINERALS CH: 1.0000 | ORAL_TABLET | Freq: Every day | ORAL | Status: AC

## 2018-11-04 MED ORDER — PANTOPRAZOLE SODIUM 40 MG PO TBEC: 40.0000 mg | DELAYED_RELEASE_TABLET | Freq: Every day | ORAL | 0 refills | Status: AC

## 2018-11-04 MED ORDER — PREDNISONE 20 MG PO TABS: ORAL_TABLET | ORAL | 0 refills | Status: AC

## 2018-11-04 MED ORDER — DOCUSATE SODIUM 100 MG PO CAPS: 100.0000 mg | ORAL_CAPSULE | Freq: Two times a day (BID) | ORAL | 0 refills | Status: AC

## 2018-11-04 NOTE — Progress Notes (Signed)
Physical Therapy Treatment Patient Details Name: Laura Stewart MRN: [id number] DOB: [birth year] Today's Date: 11/04/2018    History of Present Illness Patient presented to the hospital with ARD on 10/25/2018 with ARF. At this time she is also having vaginal bleeding. She presents with morbid obesity.     PT Comments    Patient was unable to sit up at the edge of the bed. The patient is confident that she will be able to with her bed at home. She will have PT working with her at home. Therapy reviewed bed exercises with her. Acute therapy will continue to progress as tolerated.    Follow Up Recommendations  SNF     Equipment Recommendations       Recommendations for Other Services Rehab consult     Precautions / Restrictions Precautions Precautions: Fall Restrictions Weight Bearing Restrictions: No    Mobility  Bed Mobility Overal bed mobility: Needs Assistance Bed Mobility: Rolling;Supine to Sit Rolling: Max assist;+2 for physical assistance;+2 for safety/equipment   Supine to sit: Total assist;+2 for physical assistance;+2 for safety/equipment     General bed mobility comments: unable to get as far today. Air matress puts patient in a tough position to get out of even deflated.  Transfers                    Ambulation/Gait                 Stairs             Wheelchair Mobility    Modified Rankin (Stroke Patients Only)       Balance                                            Cognition Arousal/Alertness: Awake/alert Behavior During Therapy: WFL for tasks assessed/performed Overall Cognitive Status: Within Functional Limits for tasks assessed                                        Exercises General Exercises - Lower Extremity Ankle Circles/Pumps: 20 reps Quad Sets: 20 reps Gluteal Sets: 20 reps Long Arc Quad: (talked about at home )    General Comments        Pertinent Vitals/Pain Pain  Assessment: No/denies pain    Home Living Family/patient expects to be discharged to:: Private residence Living Arrangements: Children Available Help at Discharge: Family Type of Home: House Home Access: Ramped entrance   Home Layout: One level        Prior Function            PT Goals (current goals can now be found in the care plan section) Acute Rehab PT Goals PT Goal Formulation: With patient Time For Goal Achievement: 11/16/18 Potential to Achieve Goals: Good Progress towards PT goals: Progressing toward goals    Frequency    Min 2X/week      PT Plan Current plan remains appropriate    Co-evaluation              AM-PAC PT "6 Clicks" Mobility   Outcome Measure  Help needed turning from your back to your side while in a flat bed without using bedrails?: A Lot Help needed moving from lying on your back to sitting on the side of  a flat bed without using bedrails?: Total Help needed moving to and from a bed to a chair (including a wheelchair)?: Total Help needed standing up from a chair using your arms (e.g., wheelchair or bedside chair)?: Total Help needed to walk in hospital room?: Total Help needed climbing 3-5 steps with a railing? : Total 6 Click Score: 7    End of Session   Activity Tolerance: Patient limited by fatigue Patient left: in bed;with call bell/phone within reach;with bed alarm set Nurse Communication: Mobility status PT Visit Diagnosis: Other abnormalities of gait and mobility (R26.89)     Time: 0225-0237 PT Time Calculation (min) (ACUTE ONLY): 12 min  Charges:  $Therapeutic Exercise: 8-22 mins             Dessie Comaavid J Zebedee Segundo PT DPT  11/04/2018, 4:32 PM

## 2018-11-04 NOTE — Progress Notes (Signed)
Pt discharged via transport. Pt currently a/o X 4, VSS, showing no signs of distress. All belongings sent with patient. MD aware of discharge. RN to continue to monitor.=

## 2018-11-04 NOTE — Progress Notes (Signed)
11/03/2018 - Case discussed in LOS with Medical Director, Assist Director of Soc Work. CM /SW will continue to follow for progression of care. B Sumner Boesch RN,MHA,BSN Advance Care Supervisor [phone] 

## 2018-11-04 NOTE — TOC Transition Note (Signed)
Transition of Care [facility]) - CM/SW Discharge Note   Patient Details  Name: Sahithi Ordoyne MRN: [id number] Date of Birth: [birth year]  Transition of Care [facility]) CM/SW Contact:  Sharin Mons, RN Phone Number: 11/04/2018, 4:48 PM   Clinical Narrative:   Admitted with Acute respiratory failure, hx morbid obesity, chronic respiratory failure on 3L [place], COPD, Pulmonary hypertension, OSA, non-compliant with CPAP, and Diastolic HF.  Transition to home today via PTAR.Resides with daughter Elmyra Ricks.  Elmyra Ricks stated she and her sister has taken of care of mom for 8yrs. Declined SNF placement.  NCM made multi attempts to arrange home health services, unsuccessful , all out of network ( [facility]), [facility], [facility], [facility], [facility]). NCM reached out to insurance CM and left voice message for in network providers [phone] [phone]), awaiting call back.MD and daughter Elmyra Ricks @ [phone] made aware.  PTA pt active with [facility] for DME home oxygen. NCM called [facility] @1 [phone] and made them aware of pt's liter flow (5). Nurse to call daughter Elmyra Ricks  when Corey Harold has arrive for pt pick up.  Final next level of care: [place] Barriers to Discharge: No Barriers Identified   Patient Goals and CMS Choice        Discharge Placement                       Discharge Plan and Services                                     Social Determinants of Health (SDOH) Interventions     Readmission Risk Interventions No flowsheet data found.

## 2018-11-04 NOTE — Discharge Summary (Signed)
Physician Discharge Summary  Forest RanchRosa Spires ZOX:[id number] DOB: 08/12/1960 DOA: 10/25/2018  PCP: No primary care provider on file.  Admit date: 10/25/2018 Discharge date: 11/04/2018  Admitted From: SNF Disposition:  Home  Recommendations for Outpatient Follow-up:  1. Follow up with PCP in 1-2 weeks 2. Please obtain BMP/CBC in one week   Home Health: YES WITH HOME OXYGEN.   Discharge Condition: guarded.  CODE STATUS: full code.  Diet recommendation: Heart Healthy    Brief/Interim Summary: 58 year old female with history of morbid obesity with BMI 110, chronic respiratory failure on 3L [place], COPD, Pulmonary hypertension, OSA, non-compliant with CPAP, and Diastolic HF transferred [facility] for acute on chronic respiratory failure.   She was found by EMS unresponsive at home with hypoxia in the 50's. On arrival to [facility], she remained hypoxic on NRB with ABG showing pH 7.12, pCO2 125.7, PO2 44.3. She was given lasix and required intubation. She had tranisent hypotension on fentanyl and versed drips requiring levophed briefly. CXR showed no infiltrate. Workup noted for normal EKG with neg troponin, BNP 79, lower extremity ultrasound was negative for DVT, BMP showed mild hyperkalemia of 5.9 which was treated with kayexalate, otherwise normal. UA showed moderate leukocytes with 6-10 WBC, WBC 9, normal procalcitonin, UDS negative, Ammonia 41, normal LFTs, COVID negative. Limited echo showed grade 2 diastolic dysfunction with EF 50-55%. She was started on ceftriaxone for UTI and azithromycin for possible COPD exacerbation. Blood cultures obtained showed GPC and enterococcus species and therefore started on vancomycin. [facility] was [facility] since patient's BMI exceeded their bariatric capabilities.Blood cultures were positive for Enterococcus species. Infectious Diseases. Patient developed gradual improvement of the mental status. The  acute encephalopathy metabolic was related to hypercarbia. Patient is extubated on 10/31/2018, placed on BiPAP. Patient at baseline uses BiPAP while in the bed. Per patient uses 4 L of oxygen through nasal cannula at baseline.    Discharge Diagnoses:  Active Problems:   Acute respiratory failure (HCC)   Enterococcal bacteremia   Acute metabolic encephalopathy   COPD with acute exacerbation (HCC)   Debility  Acute on chronic hypoxic, hypercarbic respiratory failure - patient has been noncompliant with CPAP - admitted with pCO2 of 115  - improved with the intubation - patient is treated as community-acquired pneumonia, COPD exacerbation with vancomycin and aztreonam. - extubated on 10/31/2018, currently on 5 lit of [place] oxygen.  - completed vanc regimen   Acute metabolic encephalopathy - 2/2 sepsis, hypercarbic respiratory failure - patient is well conscious oriented x3  Acute on chronic diastolic congestive heart failure resolved.   Obstructive sleep apnea - noncompliant with the CPAP at home - had discussions regarding trach placement, family refused  Sepsis secondary to tracheobronchitis, UTI - blood cultures 1/2 positive for Enterococcus species - appreciate ID consult  - completed vancomycin regimen  COPD exacerbation - continue the breathing treatments, antibiotics, Solu-Medrol - tapere solumedrol to prednisone for the next few days.  No wheezing heard.    Morbid obesity - patient has BMI of 110  - patient will benefit from surgical options of gastric bypass surgery  Severe debility  - PT eval recommending SNF.     Discharge Instructions  Discharge Instructions    Diet - low sodium heart healthy   Complete by: As directed    Increase activity slowly   Complete by: As directed      Allergies as of 11/04/2018   Not on File  Medication List    STOP taking these  medications   pseudoephedrine-guaifenesin 60-600 MG 12 hr tablet Commonly known as: MUCINEX D     TAKE these medications   albuterol (2.5 MG/3ML) 0.083% nebulizer solution Commonly known as: PROVENTIL Take 2.5 mg by nebulization every 6 (six) hours as needed for wheezing or shortness of breath.   aspirin 325 MG tablet Take 325 mg by mouth daily.   budesonide-formoterol 80-4.5 MCG/ACT inhaler Commonly known as: SYMBICORT Inhale 2 puffs into the lungs daily.   busPIRone 30 MG tablet Commonly known as: BUSPAR Take 30 mg by mouth 2 (two) times daily.   docusate sodium 100 MG capsule Commonly known as: COLACE Take 1 capsule (100 mg total) by mouth 2 (two) times daily.   Ensure Max Protein Liqd Take 330 mLs (11 oz total) by mouth daily. Start taking on: November 05, 2018   ferrous sulfate 325 (65 FE) MG EC tablet Take 325 mg by mouth daily.   furosemide 40 MG tablet Commonly known as: LASIX Take 40 mg by mouth daily.   multivitamin with minerals Tabs tablet Take 1 tablet by mouth daily. Start taking on: November 05, 2018   pantoprazole 40 MG tablet Commonly known as: Protonix Take 1 tablet (40 mg total) by mouth daily.   predniSONE 20 MG tablet Commonly known as: Deltasone Prednisone 40 mg daily for 3 days followed by  Prednisone 20 mg daily for 3 days. Start taking on: November 05, 2018   senna 8.6 MG Tabs tablet Commonly known as: SENOKOT Take 1 tablet (8.6 mg total) by mouth 2 (two) times daily.   SERTRALINE HCL PO Take 1 tablet by mouth daily.   topiramate 25 MG tablet Commonly known as: TOPAMAX Take 50 mg by mouth at bedtime.            Durable Medical Equipment  (From admission, onward)         Start     Ordered   11/04/18 1413  DME Oxygen  Once    Question Answer Comment  Length of Need Lifetime   Mode or (Route) Nasal cannula   Liters per Minute 5   Frequency Continuous (stationary and portable oxygen unit needed)   Oxygen conserving device Yes    Oxygen delivery system Gas      11/04/18 1412         Follow-up Information    primary care physician. Schedule an appointment as soon as possible for a visit in 1 week(s).          Not on File  Consultations:  ID   PCCM.    Procedures/Studies: Dg Abd 1 View  Result Date: 10/27/2018 CLINICAL DATA:  Orogastric tube placement. EXAM: ABDOMEN - 1 VIEW COMPARISON:  None. FINDINGS: The bowel gas pattern is normal. Distal tip of enteric tube is seen in expected position of the proximal stomach. No radio-opaque calculi or other significant radiographic abnormality are seen. IMPRESSION: Distal tip of enteric tube seen in expected position of proximal stomach. No evidence of bowel obstruction or ileus. Electronically Signed   By: Marijo Conception M.D.   On: 10/27/2018 14:59   Dg Chest Port 1 View  Result Date: 10/31/2018 CLINICAL DATA:  Acute respiratory failure. EXAM: PORTABLE CHEST 1 VIEW COMPARISON:  10/29/2018. FINDINGS: Interim extubation and removal of NG tube. Left PICC line stable position. Stable cardiomegaly. Interim improved aeration with partial clearing of pulmonary interstitial edema. Low lung volumes. Small bilateral pleural effusions cannot be excluded.  No pneumothorax. IMPRESSION: 1. Interim extubation removal of NG tube. Left PICC line stable position. 2. Stable cardiomegaly. Interim improved aeration with partial clearing of pulmonary interstitial edema. Low lung volumes. Small pleural effusions cannot be excluded. Electronically Signed   By: Maisie Fus  Register   On: 10/31/2018 07:54   Dg Chest Port 1 View  Result Date: 10/29/2018 CLINICAL DATA:  Respiratory failure. EXAM: PORTABLE CHEST 1 VIEW COMPARISON:  10/28/2018 FINDINGS: Endotracheal tube terminates midway between the clavicles and carina, unchanged. Enteric tube courses off the inferior margin of the image. Central venous catheter is unchanged. The cardiac silhouette remains enlarged with similar appearance of  pulmonary vascular congestion and mild interstitial opacities. No large pleural effusion or pneumothorax is identified. IMPRESSION: Unchanged cardiomegaly and mild edema. Electronically Signed   By: Sebastian Ache M.D.   On: 10/29/2018 08:07   Dg Chest Port 1 View  Result Date: 10/28/2018 CLINICAL DATA:  Respiratory failure EXAM: PORTABLE CHEST 1 VIEW COMPARISON:  10/27/2018 FINDINGS: Cardiac shadow remains enlarged. Endotracheal tube, gastric catheter and left-sided PICC line are again identified and stable. Increasing central vascular congestion is noted with mild interstitial edema. No focal confluent infiltrate is seen. IMPRESSION: Mild increase in CHF when compared with the previous day. Electronically Signed   By: Alcide Clever M.D.   On: 10/28/2018 07:55   Dg Chest Port 1 View  Result Date: 10/27/2018 CLINICAL DATA:  Respiratory failure. EXAM: PORTABLE CHEST 1 VIEW COMPARISON:  10/26/2018 FINDINGS: The ET tube tip is 4 cm above the carina. Stable cardiac enlargement. Similar appearance of pulmonary vascular congestion. No airspace opacities. IMPRESSION: 1. Stable ET tube. 2. Cardiac enlargement and pulmonary vascular congestion Electronically Signed   By: Signa Kell M.D.   On: 10/27/2018 08:33   Dg Chest Port 1 View  Result Date: 10/26/2018 CLINICAL DATA:  Check endotracheal tube placement EXAM: PORTABLE CHEST 1 VIEW COMPARISON:  10/25/2018 FINDINGS: Endotracheal tube and gastric catheter are noted in satisfactory position. Cardiac shadow remains enlarged. Vascular congestion is noted but slightly improved when compare with the prior exam. Patchy right basilar infiltrate is again seen. No bony abnormality is noted. IMPRESSION: Stable appearance of the chest from the previous day. Electronically Signed   By: Alcide Clever M.D.   On: 10/26/2018 08:13   Dg Chest Port 1 View  Result Date: 10/25/2018 CLINICAL DATA:  58 year old female intubated. Testing for COVID-19 pending. EXAM: PORTABLE CHEST 1  VIEW COMPARISON:  None. FINDINGS: Portable AP semi upright view at 0221 hours. Endotracheal tube tip in good position between the level the clavicles and carina. Enteric tube courses to the abdomen, tip not included. Mild-to-moderate cardiomegaly. Other mediastinal contours are within normal limits. Normal lung volumes. No pneumothorax. Pulmonary vascular congestion. Veiling opacity at the right lung base. Dense retrocardiac opacity. There is also patchy but indistinct right mid lung opacity. IMPRESSION: 1. ET tube in good position. Enteric tube courses to the abdomen, tip not included. 2. Cardiomegaly and pulmonary vascular congestion. 3. Mid and lower lung opacity may reflect a combination of multifocal pneumonia versus atelectasis. Possible small right pleural effusion. Electronically Signed   By: Odessa Fleming M.D.   On: 10/25/2018 02:54   [place] Ekg Site Rite  Result Date: 10/27/2018 If Site Rite image not attached, placement could not be confirmed due to current cardiac rhythm.    Subjective: No chest pain or sob.   Discharge Exam: Vitals:   11/04/18 0800 11/04/18 0802  BP:    Pulse: 86  Resp: 20   Temp:    SpO2: 97% 100%   Vitals:   11/04/18 0446 11/04/18 0733 11/04/18 0800 11/04/18 0802  BP:  124/61    Pulse:  90 86   Resp:  16 20   Temp:  98.3 F (36.8 C)    TempSrc:  Oral    SpO2:  99% 97% 100%  Weight: (!) 176 kg     Height:        General: Pt is alert, awake, not in acute distress Cardiovascular: RRR, S1/S2 +, no rubs, no gallops Respiratory: CTA bilaterally, no wheezing, no rhonchi Abdominal: Soft, NT, ND, bowel sounds +     The results of significant diagnostics from this hospitalization (including imaging, microbiology, ancillary and laboratory) are listed below for reference.     Microbiology: No results found for this or any previous visit (from the past 240 hour(s)).   Labs: BNP (last 3 results) No results for input(s): BNP in the last 8760 hours. Basic  Metabolic Panel: Recent Labs  Lab 10/29/18 0422 10/30/18 0524 10/31/18 [id number] 10/31/18 1131 10/31/18 2344 11/01/18 0445 11/02/18 0412 11/03/18 0333 11/04/18 0419  NA 144 146* 149* 142 143  --  142  --   --   K 3.8 4.2 4.6 4.5 4.7  --  4.9  --   --   CL 101 101 100  --  98  --  100  --   --   CO2 33* 33* 39*  --  35*  --  34*  --   --   GLUCOSE 161* 156* 132*  --  119*  --  151*  --   --   BUN 74* 84* 87*  --  70*  --  53*  --   --   CREATININE 1.40* 1.46* 1.47*  --  1.16*  --  1.19*  --   --   CALCIUM 8.2* 7.8* 7.8*  --  8.0*  --  8.6*  --   --   MG 2.5*  --  2.4  --   --  2.3 2.5* 2.4 2.3  PHOS 4.1  --  5.4*  --   --  3.8 3.7 3.6 3.7   Liver Function Tests: Recent Labs  Lab 11/02/18 0412  ALBUMIN 2.5*   No results for input(s): LIPASE, AMYLASE in the last 168 hours. No results for input(s): AMMONIA in the last 168 hours. CBC: Recent Labs  Lab 10/29/18 0422  10/31/18 0619 10/31/18 1131 11/01/18 0445 11/02/18 0412 11/03/18 0333 11/04/18 0419  WBC 5.8   < > 8.6  --  6.9 7.3 9.6 8.2  NEUTROABS 4.8  --   --   --  5.8 6.1 6.9 5.0  HGB 9.5*   < > 10.5* 13.6 10.4* 11.0* 10.5* 10.5*  HCT 33.4*   < > 36.9 40.0 36.6 38.8 36.8 36.7  MCV 94.6   < > 96.9  --  95.6 94.9 94.6 95.1  PLT 137*   < > 152  --  143* 152 158 166   < > = values in this interval not displayed.   Cardiac Enzymes: Recent Labs  Lab 11/01/18 0445  TROPONINI 0.04*   BNP: Invalid input(s): POCBNP CBG: Recent Labs  Lab 11/04/18 0012 11/04/18 0422 11/04/18 0744 11/04/18 1232 11/04/18 1643  GLUCAP 102* 88 78 118* 132*   D-Dimer No results for input(s): DDIMER in the last 72 hours. Hgb A1c No results for input(s): HGBA1C in the last 72 hours. Lipid  Profile No results for input(s): CHOL, HDL, LDLCALC, TRIG, CHOLHDL, LDLDIRECT in the last 72 hours. Thyroid function studies No results for input(s): TSH, T4TOTAL, T3FREE, THYROIDAB in the last 72 hours.  Invalid input(s): FREET3 Anemia work up No  results for input(s): VITAMINB12, FOLATE, FERRITIN, TIBC, IRON, RETICCTPCT in the last 72 hours. Urinalysis    Component Value Date/Time   COLORURINE STRAW (A) 11/03/2018 1325   APPEARANCEUR CLEAR 11/03/2018 1325   LABSPEC 1.010 11/03/2018 1325   PHURINE 7.0 11/03/2018 1325   GLUCOSEU NEGATIVE 11/03/2018 1325   HGBUR NEGATIVE 11/03/2018 1325   BILIRUBINUR NEGATIVE 11/03/2018 1325   KETONESUR NEGATIVE 11/03/2018 1325   PROTEINUR NEGATIVE 11/03/2018 1325   NITRITE NEGATIVE 11/03/2018 1325   LEUKOCYTESUR MODERATE (A) 11/03/2018 1325   Sepsis Labs Invalid input(s): PROCALCITONIN,  WBC,  LACTICIDVEN Microbiology No results found for this or any previous visit (from the past 240 hour(s)).   Time coordinating discharge: 35  minutes  SIGNED:   Kathlen ModyVijaya Jaela Yepez, MD  [facility] 11/04/2018, 5:20 PM Pager   If 7PM-7AM, please contact night-coverage [email or web address] Password [email or web address]

## 2018-11-05 LAB — URINE CULTURE: Culture: NO GROWTH

## 2020-02-05 IMAGING — DX PORTABLE CHEST - 1 VIEW
1 series · 1 of 1 positions shown · non-contrast
Comparison: 10/25/2018

CLINICAL DATA: Check endotracheal tube placement

EXAM:
PORTABLE CHEST 1 VIEW

[chest]
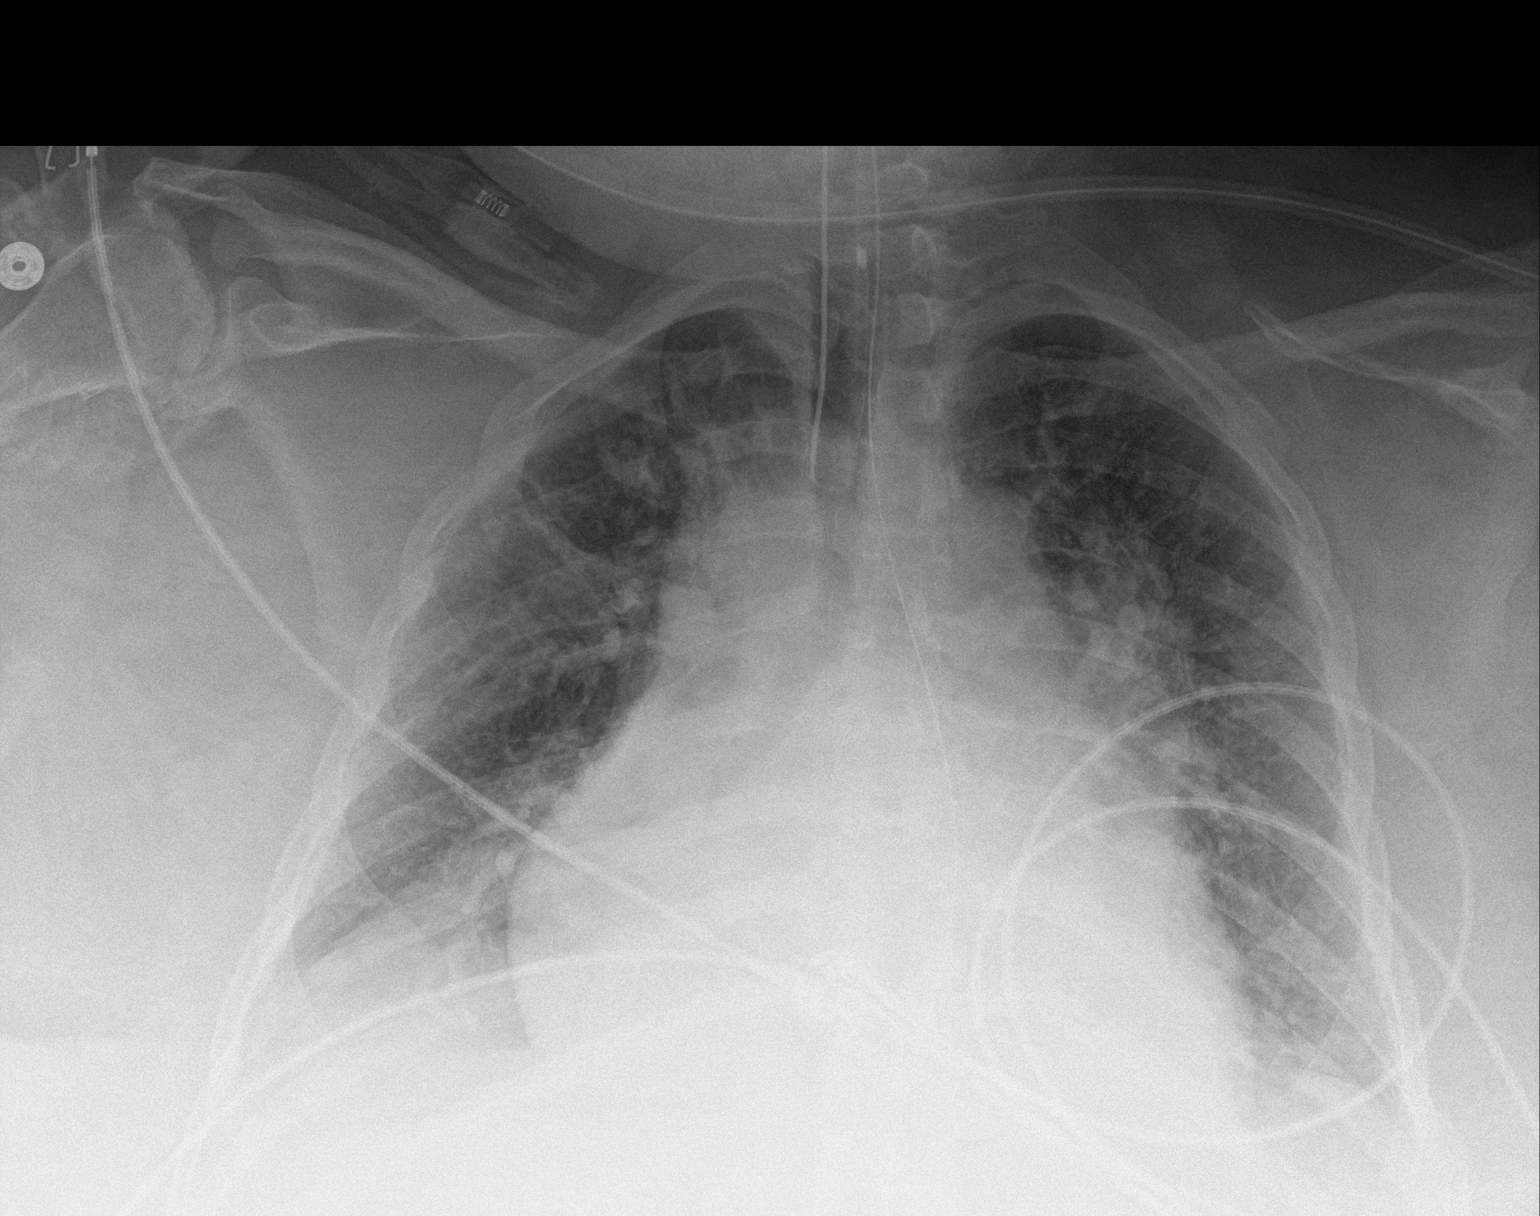

[1 of 1 positions shown; findings below may reference images not displayed]

FINDINGS: Endotracheal tube and gastric catheter are noted in satisfactory
position. Cardiac shadow remains enlarged. Vascular congestion is
noted but slightly improved when compare with the prior exam. Patchy
right basilar infiltrate is again seen. No bony abnormality is
noted.
IMPRESSION: Stable appearance of the chest from the previous day.

## 2020-02-06 IMAGING — DX ABDOMEN - 1 VIEW
1 series · 1 of 1 positions shown · non-contrast
Comparison: None.

CLINICAL DATA: Orogastric tube placement.

EXAM:
ABDOMEN - 1 VIEW

[abdomen kub]
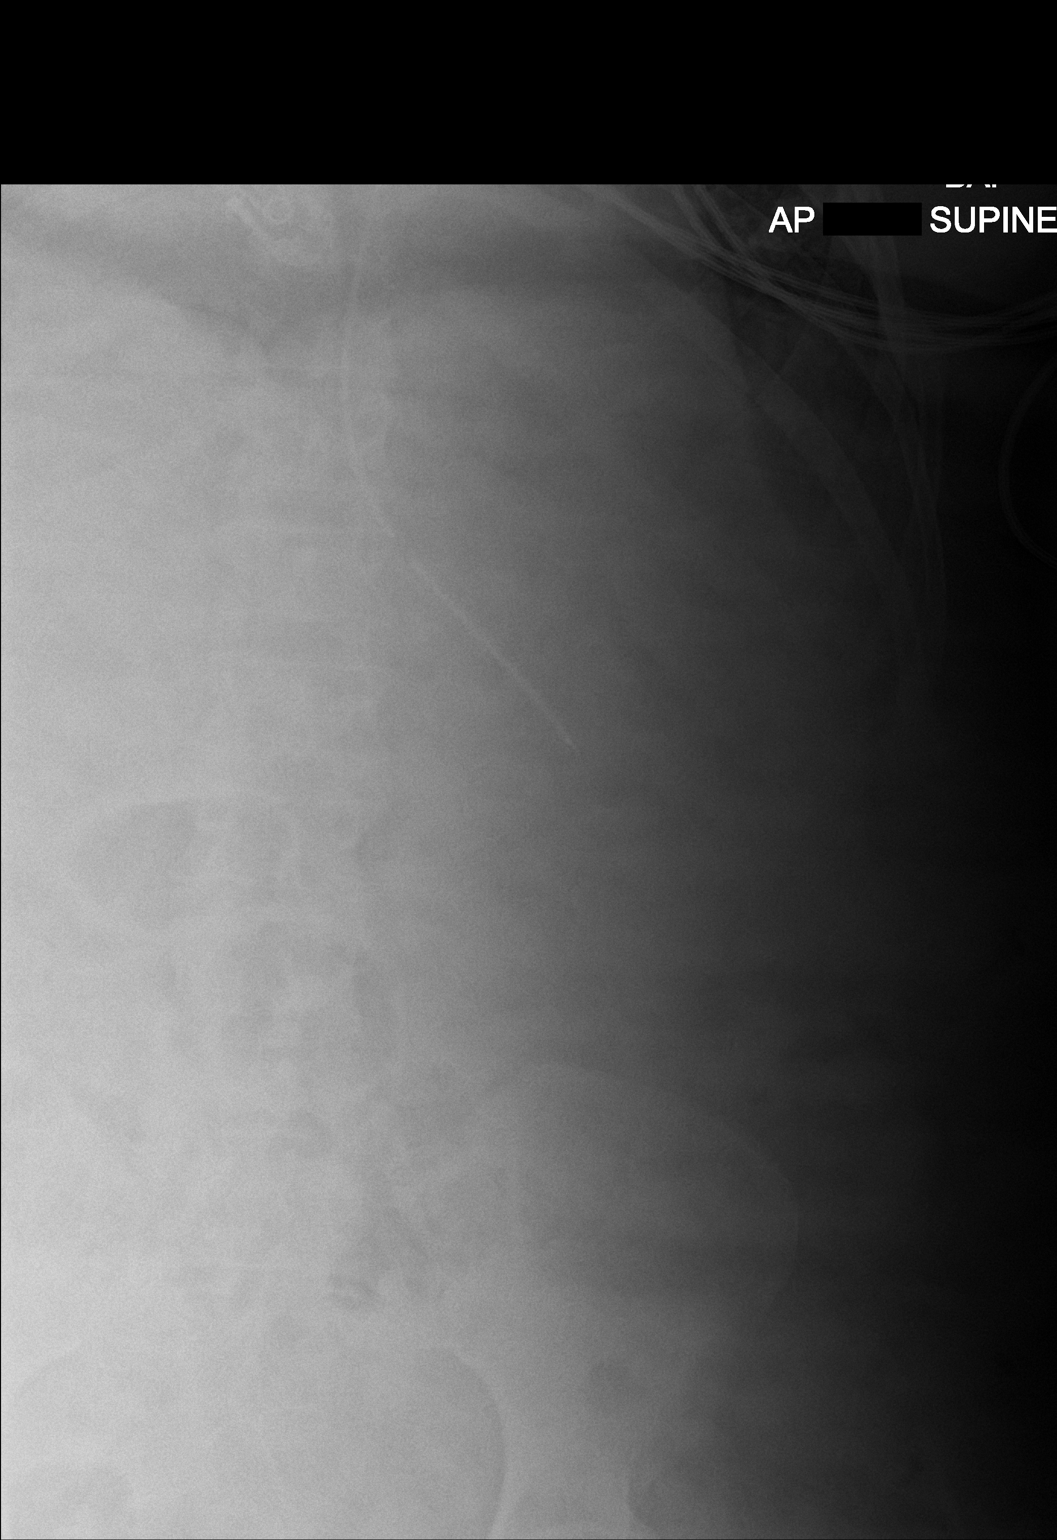

[1 of 1 positions shown; findings below may reference images not displayed]

FINDINGS: The bowel gas pattern is normal. Distal tip of enteric tube is seen
in expected position of the proximal stomach. No radio-opaque
calculi or other significant radiographic abnormality are seen.
IMPRESSION: Distal tip of enteric tube seen in expected position of proximal
stomach. No evidence of bowel obstruction or ileus.

## 2020-02-10 IMAGING — DX PORTABLE CHEST - 1 VIEW
2 series · 2 of 2 positions shown · non-contrast
Comparison: 10/29/2018.

CLINICAL DATA: Acute respiratory failure.

EXAM:
PORTABLE CHEST 1 VIEW

[chest ap (1 of 2)]
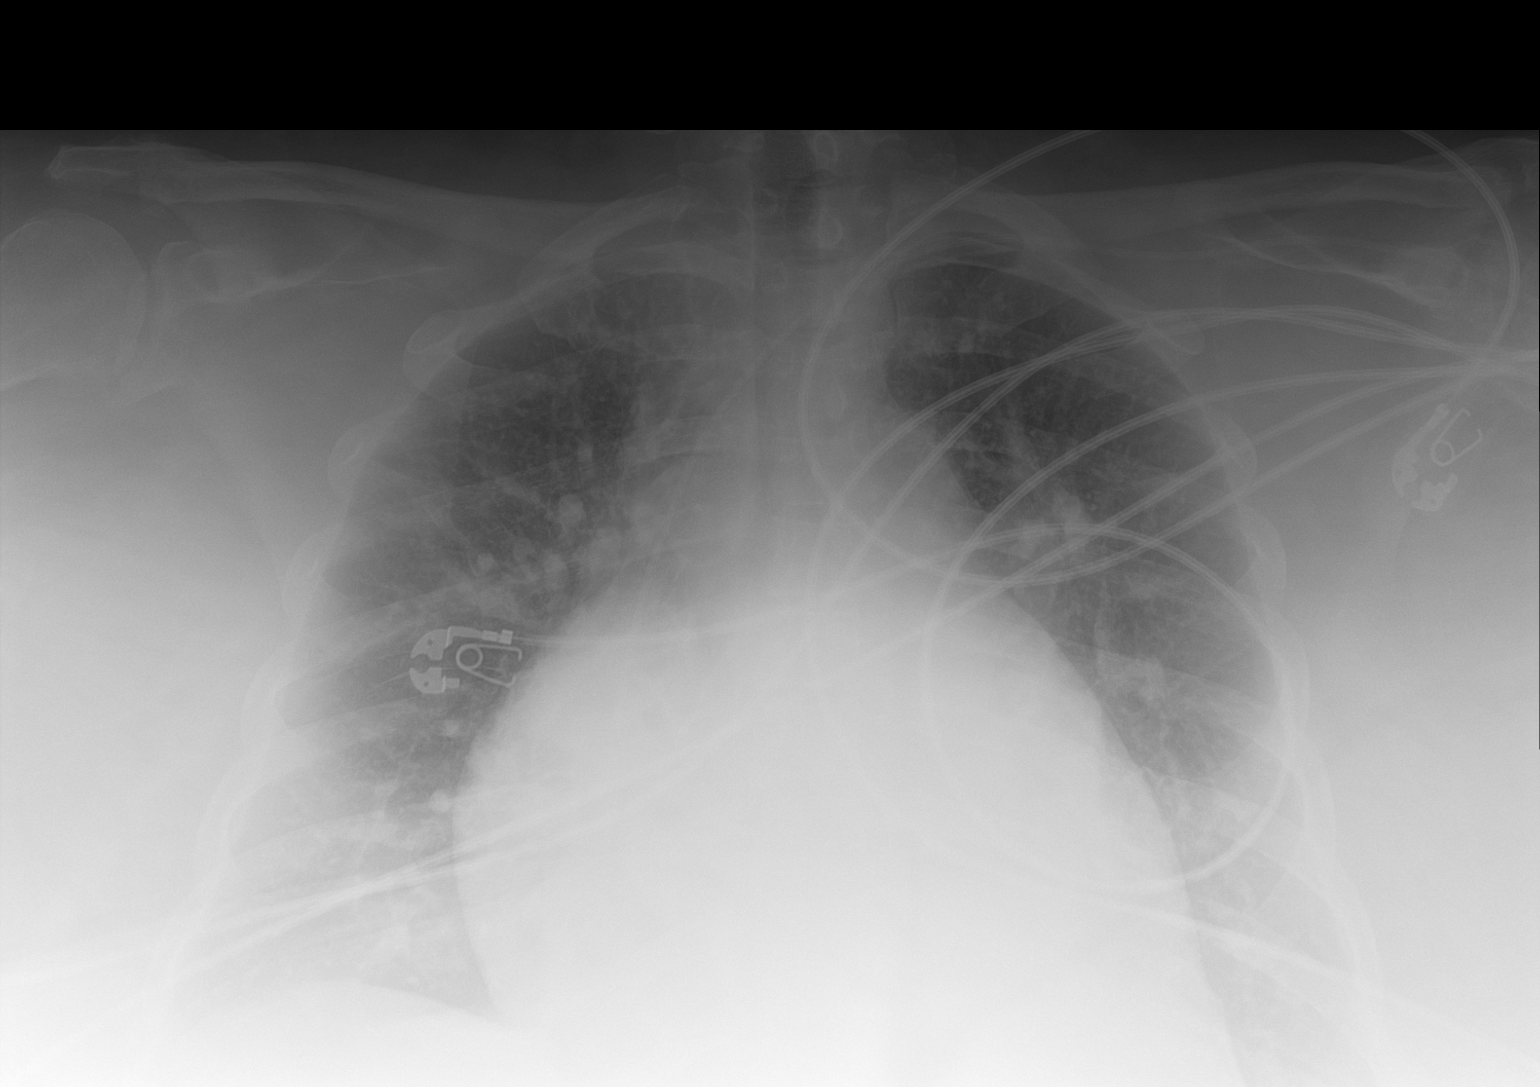

[chest ap (2 of 2)]
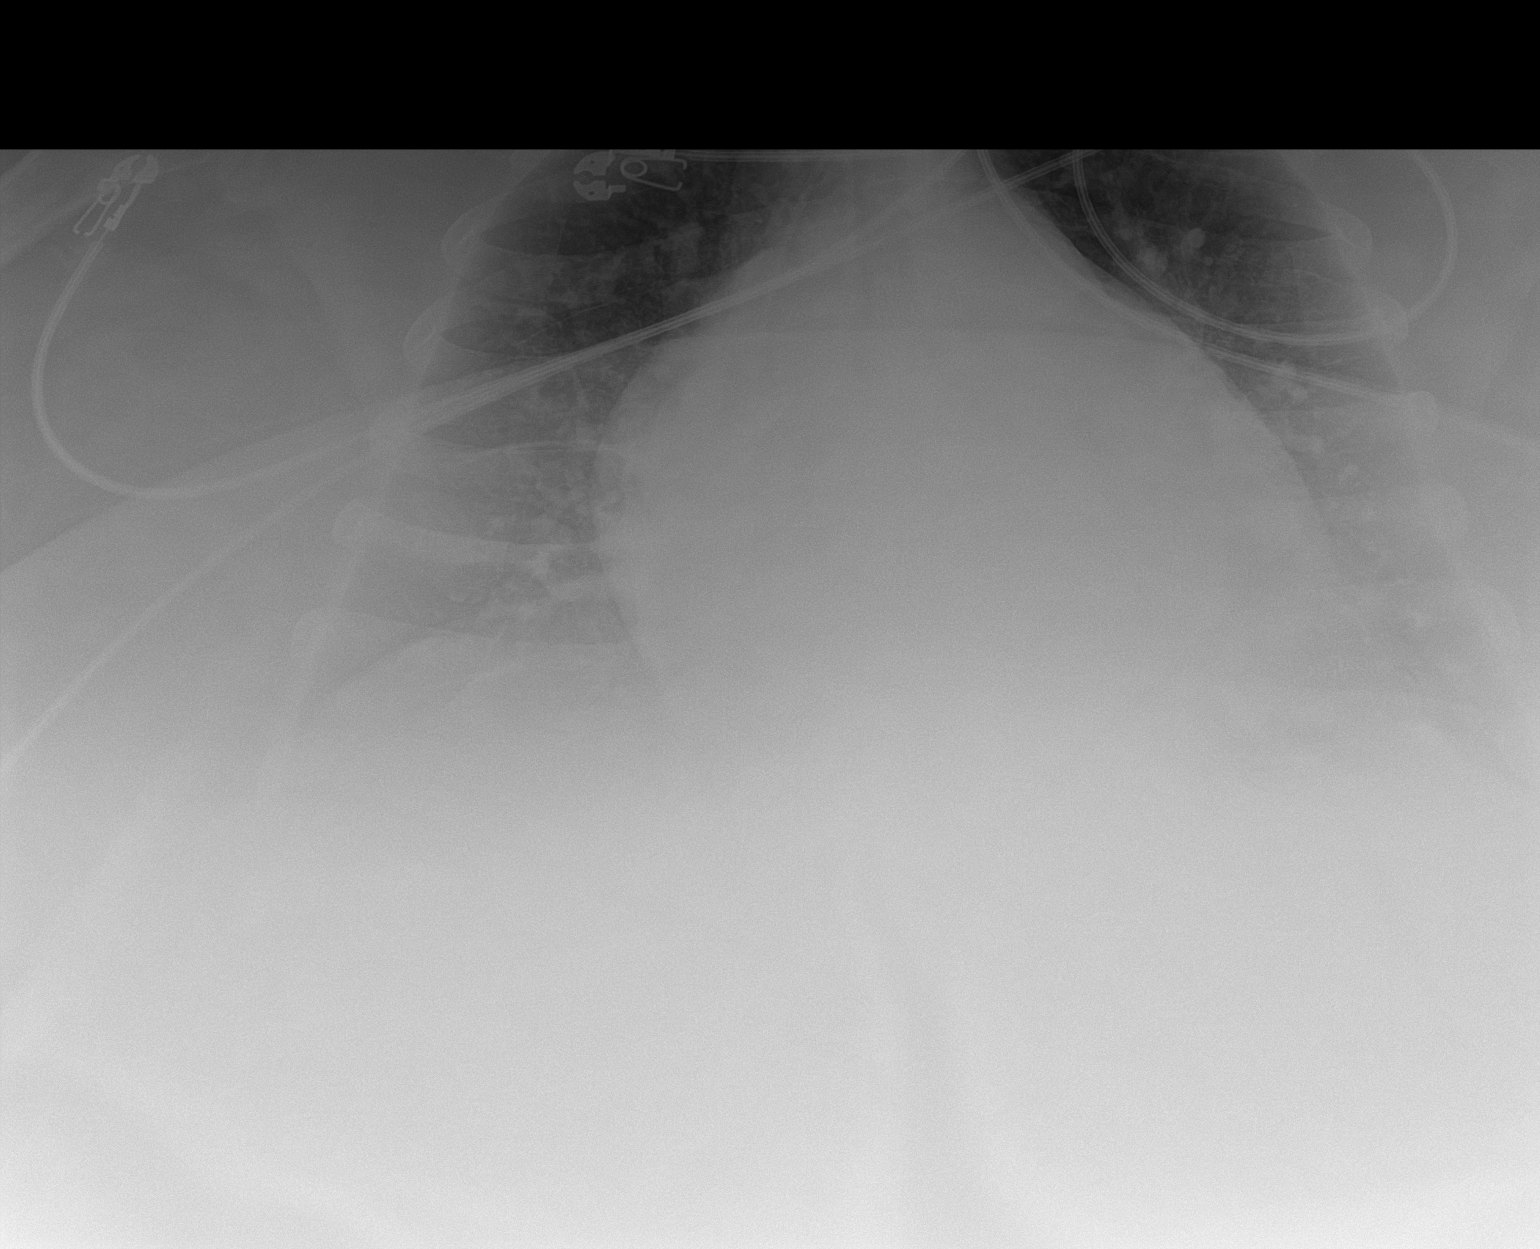

[2 of 2 positions shown; findings below may reference images not displayed]

FINDINGS: Interim extubation and removal of NG tube. Left PICC line stable
position. Stable cardiomegaly. Interim improved aeration with
partial clearing of pulmonary interstitial edema. Low lung volumes.
Small bilateral pleural effusions cannot be excluded. No
pneumothorax.
IMPRESSION: 1. Interim extubation removal of NG tube. Left PICC line stable
position.

2. Stable cardiomegaly. Interim improved aeration with partial
clearing of pulmonary interstitial edema. Low lung volumes. Small
pleural effusions cannot be excluded.
